# Patient Record
Sex: Female | Born: 1935 | Race: White | Hispanic: No | Smoking: Never smoker
Health system: Southern US, Community
[De-identification: ages and names within clinical notes are randomized; demographics above are authoritative.]

## PROBLEM LIST (undated history)

## (undated) DIAGNOSIS — I10 Essential (primary) hypertension: Secondary | ICD-10-CM

## (undated) DIAGNOSIS — F0280 Dementia in other diseases classified elsewhere without behavioral disturbance: Secondary | ICD-10-CM

## (undated) DIAGNOSIS — M810 Age-related osteoporosis without current pathological fracture: Secondary | ICD-10-CM

## (undated) DIAGNOSIS — G309 Alzheimer's disease, unspecified: Secondary | ICD-10-CM

## (undated) DIAGNOSIS — F0390 Unspecified dementia without behavioral disturbance: Secondary | ICD-10-CM

---

## 2004-06-15 ENCOUNTER — Ambulatory Visit: Payer: Self-pay | Admitting: Internal Medicine

## 2005-06-30 ENCOUNTER — Ambulatory Visit: Payer: Self-pay | Admitting: Internal Medicine

## 2005-12-09 ENCOUNTER — Ambulatory Visit: Payer: Self-pay | Admitting: Internal Medicine

## 2006-07-01 ENCOUNTER — Ambulatory Visit: Payer: Self-pay | Admitting: Internal Medicine

## 2007-03-30 ENCOUNTER — Ambulatory Visit: Payer: Self-pay | Admitting: Pain Medicine

## 2007-04-12 ENCOUNTER — Ambulatory Visit: Payer: Self-pay | Admitting: Pain Medicine

## 2007-05-16 ENCOUNTER — Ambulatory Visit: Payer: Self-pay | Admitting: Pain Medicine

## 2007-05-29 ENCOUNTER — Ambulatory Visit: Payer: Self-pay | Admitting: Pain Medicine

## 2007-06-27 ENCOUNTER — Ambulatory Visit: Payer: Self-pay | Admitting: Pain Medicine

## 2007-07-05 ENCOUNTER — Ambulatory Visit: Payer: Self-pay | Admitting: Pain Medicine

## 2007-07-19 ENCOUNTER — Ambulatory Visit: Payer: Self-pay | Admitting: Internal Medicine

## 2007-08-01 ENCOUNTER — Ambulatory Visit: Payer: Self-pay | Admitting: Pain Medicine

## 2007-08-07 ENCOUNTER — Ambulatory Visit: Payer: Self-pay | Admitting: Pain Medicine

## 2007-08-14 ENCOUNTER — Ambulatory Visit: Payer: Self-pay | Admitting: Pain Medicine

## 2007-08-29 ENCOUNTER — Ambulatory Visit: Payer: Self-pay | Admitting: Pain Medicine

## 2007-10-03 ENCOUNTER — Ambulatory Visit: Payer: Self-pay | Admitting: Pain Medicine

## 2008-07-19 ENCOUNTER — Ambulatory Visit: Payer: Self-pay | Admitting: Internal Medicine

## 2009-07-21 ENCOUNTER — Ambulatory Visit: Payer: Self-pay | Admitting: Internal Medicine

## 2010-05-02 ENCOUNTER — Ambulatory Visit: Payer: Self-pay

## 2010-07-01 ENCOUNTER — Ambulatory Visit: Payer: Self-pay | Admitting: General Practice

## 2010-07-27 ENCOUNTER — Ambulatory Visit: Payer: Self-pay | Admitting: Internal Medicine

## 2011-02-06 ENCOUNTER — Emergency Department: Payer: Self-pay | Admitting: Emergency Medicine

## 2011-03-03 ENCOUNTER — Ambulatory Visit: Payer: Self-pay | Admitting: Ophthalmology

## 2011-08-12 ENCOUNTER — Ambulatory Visit: Payer: Self-pay | Admitting: Internal Medicine

## 2012-08-16 ENCOUNTER — Ambulatory Visit: Payer: Self-pay | Admitting: Internal Medicine

## 2012-08-16 ENCOUNTER — Ambulatory Visit: Payer: Self-pay | Admitting: Ophthalmology

## 2012-08-28 ENCOUNTER — Ambulatory Visit: Payer: Self-pay | Admitting: Ophthalmology

## 2013-10-17 ENCOUNTER — Ambulatory Visit: Payer: Self-pay | Admitting: Internal Medicine

## 2014-10-18 NOTE — Op Note (Signed)
PATIENT NAME:  Madeline Franco, Madeline Franco MR#:  [id number] DATE OF BIRTH:  1920-07-30  DATE OF PROCEDURE:  08/28/2012  PREOPERATIVE DIAGNOSIS:  Cataract, right eye.   POSTOPERATIVE DIAGNOSIS:  Cataract, right eye.  PROCEDURE PERFORMED:  Extracapsular cataract extraction using phacoemulsification with placement of an Alcon [id number], 17.0-diopter posterior chamber lens, serial [id number].  SURGEON:  Erline LevineSteven A Dingeldein, MD  ASSISTANT:  None.  ANESTHESIA:  4% lidocaine and 0.75% Marcaine in a 50/50 mixture with 10 units/mL of Hylenex added given as a peribulbar.  ANESTHESIOLOGIST:  Cena BentonG. F. Van Staveren, MD    COMPLICATIONS:  None.  ESTIMATED BLOOD LOSS:  Less than 1 mL.  DESCRIPTION OF PROCEDURE:  The patient was brought to the operating room and given a peribulbar block.  The patient was then prepped and draped in the usual fashion.  The vertical rectus muscles were imbricated using 5-0 silk sutures.  These sutures were then clamped to the sterile drapes as bridle sutures.  A limbal peritomy was performed extending two clock hours and hemostasis was obtained with cautery.  A partial thickness scleral groove was made at the surgical limbus and dissected anteriorly in a lamellar dissection using an Alcon crescent knife.  The anterior chamber was entered superonasally with a Superblade and through the lamellar dissection with a 2.6 mm keratome.  DisCoVisc was used to replace the aqueous and a continuous tear capsulorrhexis was carried out.  Hydrodissection and hydrodelineation were carried out with balanced salt and a 27 gauge canula.  The nucleus was rotated to confirm the effectiveness of the hydrodissection.  Phacoemulsification was carried out using a divide-and-conquer technique.  Total ultrasound time was 1 minute and 3.5 seconds with an average power of 24.0 percent, CDE of 28.16.  Irrigation/aspiration was used to remove the residual cortex.  DisCoVisc was used to inflate the capsule and the  internal incision was enlarged to 3 mm with the crescent knife.  The intraocular lens was folded and inserted into the capsular bag using the AcrySert Delivery System.   Irrigation/aspiration was used to remove the residual DisCoVisc.  Miostat was injected into the anterior chamber through the paracentesis track to inflate the anterior chamber and induce miosis; 0.1% of cefuroxime was injected into the anterior chamber directly above the lens.  The wound was checked for leaks and none were found. The conjunctiva was closed with cautery and the bridle sutures were removed.  Two drops of 0.3% Vigamox were placed on the eye.   An eye shield was placed on the eye.  The patient was discharged to the recovery room in good condition.  ____________________________ Maylon PeppersSteven A. Dingeldein, MD sad:cb D: 08/28/2012 13:34:58 ET T: 08/28/2012 13:48:06 ET JOB#: [id number]  cc: Viviann SpareSteven A. Dingeldein, MD, <Dictator> Erline LevineSTEVEN A DINGELDEIN MD ELECTRONICALLY SIGNED 09/04/2012 8:23

## 2016-07-12 ENCOUNTER — Other Ambulatory Visit: Payer: Self-pay | Admitting: Orthopedic Surgery

## 2016-07-12 ENCOUNTER — Ambulatory Visit
Admission: RE | Admit: 2016-07-12 | Discharge: 2016-07-12 | Disposition: A | Discharge status: Home / Self Care | Payer: Medicare Other | Source: Ambulatory Visit | Attending: Orthopedic Surgery | Admitting: Orthopedic Surgery

## 2016-07-12 DIAGNOSIS — M546 Pain in thoracic spine: Secondary | ICD-10-CM | POA: Diagnosis present

## 2016-07-12 DIAGNOSIS — M47812 Spondylosis without myelopathy or radiculopathy, cervical region: Secondary | ICD-10-CM | POA: Insufficient documentation

## 2016-07-12 DIAGNOSIS — M438X4 Other specified deforming dorsopathies, thoracic region: Secondary | ICD-10-CM | POA: Diagnosis not present

## 2017-03-17 ENCOUNTER — Encounter: Payer: Self-pay | Admitting: Emergency Medicine

## 2017-03-17 ENCOUNTER — Emergency Department: Payer: Medicare Other

## 2017-03-17 ENCOUNTER — Emergency Department
Admission: EM | Admit: 2017-03-17 | Discharge: 2017-03-17 | Disposition: A | Discharge status: Home / Self Care | Payer: Medicare Other | Attending: Emergency Medicine | Admitting: Emergency Medicine

## 2017-03-17 DIAGNOSIS — G309 Alzheimer's disease, unspecified: Secondary | ICD-10-CM | POA: Insufficient documentation

## 2017-03-17 DIAGNOSIS — W06XXXA Fall from bed, initial encounter: Secondary | ICD-10-CM | POA: Insufficient documentation

## 2017-03-17 DIAGNOSIS — Y939 Activity, unspecified: Secondary | ICD-10-CM | POA: Insufficient documentation

## 2017-03-17 DIAGNOSIS — S41112A Laceration without foreign body of left upper arm, initial encounter: Secondary | ICD-10-CM

## 2017-03-17 DIAGNOSIS — Y999 Unspecified external cause status: Secondary | ICD-10-CM | POA: Insufficient documentation

## 2017-03-17 DIAGNOSIS — Z7982 Long term (current) use of aspirin: Secondary | ICD-10-CM | POA: Insufficient documentation

## 2017-03-17 DIAGNOSIS — Z79899 Other long term (current) drug therapy: Secondary | ICD-10-CM | POA: Diagnosis not present

## 2017-03-17 DIAGNOSIS — S41102A Unspecified open wound of left upper arm, initial encounter: Secondary | ICD-10-CM | POA: Insufficient documentation

## 2017-03-17 DIAGNOSIS — I10 Essential (primary) hypertension: Secondary | ICD-10-CM | POA: Insufficient documentation

## 2017-03-17 DIAGNOSIS — Y92193 Bedroom in other specified residential institution as the place of occurrence of the external cause: Secondary | ICD-10-CM | POA: Insufficient documentation

## 2017-03-17 DIAGNOSIS — F0280 Dementia in other diseases classified elsewhere without behavioral disturbance: Secondary | ICD-10-CM | POA: Diagnosis not present

## 2017-03-17 DIAGNOSIS — W19XXXA Unspecified fall, initial encounter: Secondary | ICD-10-CM

## 2017-03-17 HISTORY — DX: Alzheimer's disease, unspecified: G30.9

## 2017-03-17 HISTORY — DX: Age-related osteoporosis without current pathological fracture: M81.0

## 2017-03-17 HISTORY — DX: Unspecified dementia, unspecified severity, without behavioral disturbance, psychotic disturbance, mood disturbance, and anxiety: F03.90

## 2017-03-17 HISTORY — DX: Essential (primary) hypertension: I10

## 2017-03-17 HISTORY — DX: Dementia in other diseases classified elsewhere, unspecified severity, without behavioral disturbance, psychotic disturbance, mood disturbance, and anxiety: F02.80

## 2017-03-17 MED ORDER — ACETAMINOPHEN 325 MG PO TABS
650.0000 mg | ORAL_TABLET | Freq: Once | ORAL | Status: AC
  Administered 2017-03-17: 650 mg via ORAL
  Filled 2017-03-17: qty 2

## 2017-03-17 NOTE — ED Provider Notes (Signed)
[facility] Emergency Department Provider Note   ____________________________________________    I have reviewed the triage vital signs and the nursing notes.   HISTORY  Chief Complaint Fall     HPI Madeline Franco is a 83 y.o. female Who presents under a fall. Patient has a history of dementia but reports that she woke up this morning as she was falling out of her bed. She states she fell onto her back. She denies head or neck injury. She reports chronic back pain and is not sure if her pain is worse today or not.denies focal deficits, moves all extremities well. Medical record only contributory regarding osteoporosis and dementia   Past Medical History:  Diagnosis Date  . Alzheimer disease   . Dementia   . Hypertension   . Osteoporosis     There are no active problems to display for this patient.   History reviewed. No pertinent surgical history.  Prior to Admission medications   Medication Sig Start Date End Date Taking? Authorizing Provider  aspirin 81 MG chewable tablet Chew 81 mg by mouth daily.   Yes [provider]  Calcium Carbonate-Vitamin D (CALTRATE 600+D PO) Take by mouth.   Yes [provider]  latanoprost (XALATAN) 0.005 % ophthalmic solution Place 1 drop into both eyes at bedtime.   Yes [provider]  Meloxicam 7.5 MG/5ML SUSP Take by mouth.   Yes [provider]  sertraline (ZOLOFT) 25 MG tablet Take 25 mg by mouth daily.   Yes [provider]     Allergies Celebrex [celecoxib] and Naprosyn [naproxen]  No family history on file.  Social History Social History  Substance Use Topics  . Smoking status: Never Smoker  . Smokeless tobacco: Never Used  . Alcohol use No    Review of Systems  Constitutional: Nodizziness Eyes: No visual changes.  ENT: No neck pain Cardiovascular: Denies chest pain. Respiratory: Denies shortness of breath.no cough Gastrointestinal: No  abdominal pain.   Genitourinary: no burning with urination Musculoskeletal: as above Skin: skin tear left arm Neurological: Negative for headaches or weakness   ____________________________________________   PHYSICAL EXAM:  VITAL SIGNS: ED Triage Vitals  Enc Vitals Group     BP 03/17/17 0641 (!) 154/83     Pulse Rate 03/17/17 0641 74     Resp 03/17/17 0641 18     Temp 03/17/17 0641 98.1 F (36.7 C)     Temp Source 03/17/17 0641 Oral     SpO2 03/17/17 0641 97 %     Weight 03/17/17 0641 65.8 kg (145 lb)     Height 03/17/17 0641 1.626 m ( )     Head Circumference --      Peak Flow --      Pain Score 03/17/17 0640 4     Pain Loc --      Pain Edu? --      Excl. in GC? --     Constitutional: Alert. No acute distress. Pleasant and interactive Eyes: Conjunctivae are normal.  Head: Atraumatic. Nose: No congestion/rhinnorhea. Mouth/Throat: Mucous membranes are moist.   Neck:  Painless ROM, no vertebral tenderness to palpation Cardiovascular: Normal rate, regular rhythm.  Good peripheral circulation. Respiratory: Normal respiratory effort.  No retractions. Lungs CTAB. Gastrointestinal: Soft and nontender. No distention.  Genitourinary: deferred Musculoskeletal: back: No vertebral tenderness to palpation of the thoracic or lumbar spine.No bruising noted.No lower extremity tenderness with full range of motion. No pain with axial load of  both hips Normal range of motion of upper extremities without pain..  Warm and well perfusediffusely Neurologic:  Normal speech and language. No gross focal neurologic deficits are appreciated.  Skin:  Skin is warm, dry. Small 3 cm curvilinear skin tear left lateral distal upper arm just proximal to the elbow Psychiatric: Mood and affect are normal. Speech and behavior are normal.  ____________________________________________   LABS (all labs ordered are listed, but only abnormal results are displayed)  Labs Reviewed - No data to  display ____________________________________________  EKG  None ____________________________________________  RADIOLOGY  X-rays ordered of the lumbar and thoracic spine given history of osteoporosis No acute fractures noted on review, chronic compression fracture T8 ____________________________________________   PROCEDURES  Procedure(s) performed: No    Critical Care performed:No ____________________________________________   INITIAL IMPRESSION / ASSESSMENT AND PLAN / ED COURSE  Pertinent labs & imaging results that were available during my care of the patient were reviewed by me and considered in my medical decision making (see chart for details).  Patient with fall from bed height. No bony abnormality is on exam. Given her osteoporosis and back pain we will x-ray the thoracic and lumbar spine. No indication for head CT and normal C-spine exam. No blood thinners  ----------------------------------------- 8:45 AM on 03/17/2017 -----------------------------------------  X-ray results are reassuring, the patient was able to sit up on her own and stand. She uses a walker at home but I feel she is appropriate for discharge this time with outpatient follow-up. Given Tylenol for pain    ____________________________________________   FINAL CLINICAL IMPRESSION(S) / ED DIAGNOSES  Final diagnoses:  Fall, initial encounter  Skin tear of left upper arm without complication, initial encounter      NEW MEDICATIONS STARTED DURING THIS VISIT:  New Prescriptions   No medications on file     Note:  This document was prepared using Dragon voice recognition software and may include unintentional dictation errors.    Jene Every, MD 03/17/17 [phone]

## 2017-03-17 NOTE — Discharge Instructions (Signed)
Madeline Franco was seen today after a fall from her bed. She had xrays of her spine which were unchanged from prior. She has an old thoracic compression fracture which is unchanged. Recommend tylenol for pain. She may need more assistance although she was able to sit herself up and ambulate, she has her walker at home. She appears to have chronic back pain from her compression fracture. Please follow up with Dr. Hyacinth Meeker

## 2017-03-17 NOTE — ED Notes (Signed)
Pt placed on bedpan to urinate. Pt voided without difficulty.

## 2017-03-17 NOTE — ED Triage Notes (Signed)
Pt presents to ED 02 from [facility] c/o fall today; pt states falling out of bed this morning; pt denies hitting head, or syncopal episode; per EMS pt was sitting in a chair when they arrived on scene; pt is c/o pain in the lower back that is chronic; pt has a small skin tear on the upper lateral left arm. Pt denies any other pain. Pt is alert, awake and oriented only to herself; pt is verbal and responds to questions.

## 2017-03-17 NOTE — ED Notes (Signed)
Pt placed on bedpan. Pt voided without difficulty.

## 2017-03-17 NOTE — ED Notes (Signed)
Pt placed on bedpan to urinate. Pt voided without difficulty. 

## 2017-03-17 NOTE — ED Notes (Signed)
Pt placed on bedpan. Pt urinated without difficulty.

## 2018-11-10 ENCOUNTER — Emergency Department: Payer: Medicare Other

## 2018-11-10 ENCOUNTER — Emergency Department
Admission: EM | Admit: 2018-11-10 | Discharge: 2018-11-10 | Disposition: A | Discharge status: Other Acute Inpatient Hospital | Payer: Medicare Other | Attending: Emergency Medicine | Admitting: Emergency Medicine

## 2018-11-10 ENCOUNTER — Other Ambulatory Visit: Payer: Self-pay

## 2018-11-10 DIAGNOSIS — M25559 Pain in unspecified hip: Secondary | ICD-10-CM | POA: Diagnosis present

## 2018-11-10 DIAGNOSIS — Z7982 Long term (current) use of aspirin: Secondary | ICD-10-CM | POA: Insufficient documentation

## 2018-11-10 DIAGNOSIS — Z79899 Other long term (current) drug therapy: Secondary | ICD-10-CM | POA: Insufficient documentation

## 2018-11-10 DIAGNOSIS — I10 Essential (primary) hypertension: Secondary | ICD-10-CM | POA: Insufficient documentation

## 2018-11-10 DIAGNOSIS — G309 Alzheimer's disease, unspecified: Secondary | ICD-10-CM | POA: Insufficient documentation

## 2018-11-10 DIAGNOSIS — W19XXXA Unspecified fall, initial encounter: Secondary | ICD-10-CM

## 2018-11-10 NOTE — ED Notes (Signed)
Pt walked with a walker with a steady gate to door and back- pt stated she had no pain while walking

## 2018-11-10 NOTE — ED Notes (Signed)
Report given to Tresa Endo, Charity fundraiser at [facility]. She requested pt be transported back via EMS.

## 2018-11-10 NOTE — ED Triage Notes (Signed)
Pt arrived via ems after having a fall in [facility]- pt states she fell off the bed and was in floor about 5 minutes- no external rotation on the right leg noted- pt complains of right hip and lower back pain

## 2018-11-10 NOTE — ED Notes (Signed)
Report and discharge instructions given to EMS and pt transported back to nursing home.

## 2018-11-10 NOTE — ED Notes (Signed)
Per pt family she ambulates with a walker at baseline

## 2018-11-10 NOTE — ED Notes (Signed)
Patient transported to X-ray 

## 2018-11-10 NOTE — ED Provider Notes (Signed)
[facility] Emergency Department Provider Note  ____________________________________________   I have reviewed the triage vital signs and the nursing notes. Where available I have reviewed prior notes and, if possible and indicated, outside hospital notes.    HISTORY  Chief Complaint Fall    HPI Madeline PieriniHelen E Franco is a 85 y.o. female for some dementia unfortunately, he also has an expressive aphasia, falls frequently, discussed with her son, EMS and they states she is at her baseline.  Patient had a fall today.  Non-syncopal fall.  She states she tripped.  She complained of hip pain on the way in, however she is not complaining of any pain at this time.  She states she feels "pretty good".  Limited history second patient baseline mental status.      Past Medical History:  Diagnosis Date  . Alzheimer disease (HCC)   . Dementia (HCC)   . Hypertension   . Osteoporosis     There are no active problems to display for this patient.   History reviewed. No pertinent surgical history.  Prior to Admission medications   Medication Sig Start Date End Date Taking? Authorizing Provider  aspirin 81 MG chewable tablet Chew 81 mg by mouth daily.    [provider]  Calcium Carbonate-Vitamin D (CALTRATE 600+D PO) Take by mouth.    [provider]  latanoprost (XALATAN) 0.005 % ophthalmic solution Place 1 drop into both eyes at bedtime.    [provider]  Meloxicam 7.5 MG/5ML SUSP Take by mouth.    [provider]  sertraline (ZOLOFT) 25 MG tablet Take 25 mg by mouth daily.    [provider]    Allergies Celebrex [celecoxib] and Naprosyn [naproxen]  History reviewed. No pertinent family history.  Social History Social History   Tobacco Use  . Smoking status: Never Smoker  . Smokeless tobacco: Never Used  Substance Use Topics  . Alcohol use: No  . Drug use: No    Review of Systems Constitutional: No  fever/chills Eyes: No visual changes. ENT: No sore throat. No stiff neck no neck pain Cardiovascular: Denies chest pain. Respiratory: Denies shortness of breath. Gastrointestinal:   no vomiting.  No diarrhea.  No constipation. Genitourinary: Negative for dysuria. Musculoskeletal: Negative lower extremity swelling Skin: Negative for rash. Neurological: Negative for severe headaches, focal weakness or numbness.   ____________________________________________   PHYSICAL EXAM:  VITAL SIGNS: ED Triage Vitals  Enc Vitals Group     BP 11/10/18 1626 (!) 168/74     Pulse Rate 11/10/18 1626 85     Resp --      Temp 11/10/18 1626 98.2 F (36.8 C)     Temp src --      SpO2 11/10/18 1626 100 %     Weight 11/10/18 1627 180 lb (81.6 kg)     Height 11/10/18 1627 5\' 5"  (1.651 m)     Head Circumference --      Peak Flow --      Pain Score 11/10/18 1627 0     Pain Loc --      Pain Edu? --      Excl. in GC? --     Constitutional: Alert and oriented to name and place unsure of the date pleasantly demented well appearing and in no acute distress. Eyes: Conjunctivae are normal Head: Atraumatic HEENT: No congestion/rhinnorhea. Mucous membranes are moist.  Oropharynx non-erythematous Neck:   Nontender with no meningismus, no masses, no stridor Cardiovascular: Normal rate,  regular rhythm. Grossly normal heart sounds.  Good peripheral circulation. Respiratory: Normal respiratory effort.  No retractions. Lungs CTAB. Abdominal: Soft and nontender. No distention. No guarding no rebound Back:  There is no focal tenderness or step off.  there is no midline tenderness there are no lesions noted. there is no CVA tenderness minimal discomfort around the coccyx but no evidence of trauma. Musculoskeletal: No lower extremity tenderness, no upper extremity tenderness. No joint effusions, no DVT signs strong distal pulses no edema I can fully without tenderness range both hips, both knees all in both ankles and  there is no evidence of upper extremity or torso injury Neurologic:  Normal speech and language. No gross focal neurologic deficits are appreciated.  Skin:  Skin is warm, dry and intact. No rash noted. Psychiatric: Mood and affect are little bit anxious. Speech and behavior are normal.  ____________________________________________   LABS (all labs ordered are listed, but only abnormal results are displayed)  Labs Reviewed - No data to display  Pertinent labs  results that were available during my care of the patient were reviewed by me and considered in my medical decision making (see chart for details). ____________________________________________  EKG  I personally interpreted any EKGs ordered by me or triage Sinus rhythm rate 69 bpm no acute ST elevation or depression, borderline LAD. ____________________________________________  RADIOLOGY  Pertinent labs & imaging results that were available during my care of the patient were reviewed by me and considered in my medical decision making (see chart for details). If possible, patient and/or family made aware of any abnormal findings.  No results found. ____________________________________________    PROCEDURES  Procedure(s) performed: None  Procedures  Critical Care performed: None  ____________________________________________   INITIAL IMPRESSION / ASSESSMENT AND PLAN / ED COURSE  Pertinent labs & imaging results that were available during my care of the patient were reviewed by me and considered in my medical decision making (see chart for details).  Patient here after a non-syncopal fall, x-rays are reassuring, family states that she usually can walk with a walker will ensure that she can ambulate with assistance at her baseline and try to get her home.  No evidence of acute decompensation will pathology.  Blood work is somewhat elevated but she is somewhat anxious about being here as her son stated that we should  expect.  She is DNR/DNI, and the strong preference is that we get her home safely and quickly without doing further work-up given prevalence of the coronavirus.  Patient seen and evaluated during the coronavirus epidemic during a time with low staffing    ____________________________________________   FINAL CLINICAL IMPRESSION(S) / ED DIAGNOSES  Final diagnoses:  None      This chart was dictated using voice recognition software.  Despite best efforts to proofread,  errors can occur which can change meaning.      Jeanmarie Plant, MD 11/10/18 [phone]

## 2018-12-22 ENCOUNTER — Emergency Department
Admission: EM | Admit: 2018-12-22 | Discharge: 2018-12-23 | Disposition: A | Discharge status: Skilled Nursing Facility | Payer: Medicare Other | Attending: Emergency Medicine | Admitting: Emergency Medicine

## 2018-12-22 ENCOUNTER — Emergency Department: Payer: Medicare Other

## 2018-12-22 ENCOUNTER — Other Ambulatory Visit: Payer: Self-pay

## 2018-12-22 DIAGNOSIS — F0280 Dementia in other diseases classified elsewhere without behavioral disturbance: Secondary | ICD-10-CM | POA: Diagnosis not present

## 2018-12-22 DIAGNOSIS — R531 Weakness: Secondary | ICD-10-CM | POA: Diagnosis present

## 2018-12-22 DIAGNOSIS — Z79899 Other long term (current) drug therapy: Secondary | ICD-10-CM | POA: Diagnosis not present

## 2018-12-22 DIAGNOSIS — N390 Urinary tract infection, site not specified: Secondary | ICD-10-CM | POA: Insufficient documentation

## 2018-12-22 DIAGNOSIS — I10 Essential (primary) hypertension: Secondary | ICD-10-CM | POA: Insufficient documentation

## 2018-12-22 DIAGNOSIS — G309 Alzheimer's disease, unspecified: Secondary | ICD-10-CM | POA: Insufficient documentation

## 2018-12-22 DIAGNOSIS — Z7982 Long term (current) use of aspirin: Secondary | ICD-10-CM | POA: Diagnosis not present

## 2018-12-22 LAB — CBC WITH DIFFERENTIAL/PLATELET
Abs Immature Granulocytes: 0.03 10*3/uL (ref 0.00–0.07)
Basophils Absolute: 0 10*3/uL (ref 0.0–0.1)
Basophils Relative: 0 %
Eosinophils Absolute: 0.1 10*3/uL (ref 0.0–0.5)
Eosinophils Relative: 1 %
HCT: 37.4 % (ref 36.0–46.0)
Hemoglobin: 12.5 g/dL (ref 12.0–15.0)
Immature Granulocytes: 0 %
Lymphocytes Relative: 16 %
Lymphs Abs: 1.6 10*3/uL (ref 0.7–4.0)
MCH: 34.8 pg — ABNORMAL HIGH (ref 26.0–34.0)
MCHC: 33.4 g/dL (ref 30.0–36.0)
MCV: 104.2 fL — ABNORMAL HIGH (ref 80.0–100.0)
Monocytes Absolute: 1.1 10*3/uL — ABNORMAL HIGH (ref 0.1–1.0)
Monocytes Relative: 11 %
Neutro Abs: 7.2 10*3/uL (ref 1.7–7.7)
Neutrophils Relative %: 72 %
Platelets: 183 10*3/uL (ref 150–400)
RBC: 3.59 MIL/uL — ABNORMAL LOW (ref 3.87–5.11)
RDW: 12.6 % (ref 11.5–15.5)
WBC: 10 10*3/uL (ref 4.0–10.5)
nRBC: 0 % (ref 0.0–0.2)

## 2018-12-22 LAB — COMPREHENSIVE METABOLIC PANEL
ALT: 18 U/L (ref 0–44)
AST: 26 U/L (ref 15–41)
Albumin: 4.1 g/dL (ref 3.5–5.0)
Alkaline Phosphatase: 80 U/L (ref 38–126)
Anion gap: 9 (ref 5–15)
BUN: 24 mg/dL — ABNORMAL HIGH (ref 8–23)
CO2: 25 mmol/L (ref 22–32)
Calcium: 9.4 mg/dL (ref 8.9–10.3)
Chloride: 101 mmol/L (ref 98–111)
Creatinine, Ser: 1.29 mg/dL — ABNORMAL HIGH (ref 0.44–1.00)
GFR calc Af Amer: 40 mL/min — ABNORMAL LOW (ref >60)
GFR calc non Af Amer: 35 mL/min — ABNORMAL LOW (ref >60)
Glucose, Bld: 162 mg/dL — ABNORMAL HIGH (ref 70–99)
Potassium: 4 mmol/L (ref 3.5–5.1)
Sodium: 135 mmol/L (ref 135–145)
Total Bilirubin: 0.9 mg/dL (ref 0.3–1.2)
Total Protein: 6.8 g/dL (ref 6.5–8.1)

## 2018-12-22 LAB — URINALYSIS, COMPLETE (UACMP) WITH MICROSCOPIC
Bilirubin Urine: NEGATIVE
Glucose, UA: NEGATIVE mg/dL
Hgb urine dipstick: NEGATIVE
Ketones, ur: NEGATIVE mg/dL
Nitrite: POSITIVE — AB
Protein, ur: NEGATIVE mg/dL
Specific Gravity, Urine: 1.019 (ref 1.005–1.030)
pH: 6 (ref 5.0–8.0)

## 2018-12-22 LAB — TROPONIN I (HIGH SENSITIVITY): Troponin I (High Sensitivity): 6 ng/L (ref <18)

## 2018-12-22 NOTE — ED Triage Notes (Signed)
Patient was observed with weakness tonight around 1700hrs. She started having tremors and was unable to feed herself. Lunch time today is the last known well. She is coming from [facility]. She is normally able to complete her own ADLS/IADLS.

## 2018-12-22 NOTE — ED Notes (Signed)
Patient transported to CT 

## 2018-12-22 NOTE — ED Provider Notes (Signed)
[facility] Emergency Department Provider Note   ____________________________________________   I have reviewed the triage vital signs and the nursing notes.   HISTORY  Chief Complaint Weakness   History limited by and level 5 caveat due to: Dementia   HPI Madeline Franco is a 85 y.o. female who presents to the emergency department today because of concern for weakness and difficulty with walking. Apparently staff noticed that patient was having difficulty with walking and weakness earlier today. The patient herself cannot give great history but does state that she felt dizzy. She denies any headache, chest pain, shortness of breath or fevers. The patient denies any falls.     Records reviewed. Per medical record review patient has a history of alzheimer's disease, dementia  Past Medical History:  Diagnosis Date  . Alzheimer disease (HCC)   . Dementia (HCC)   . Hypertension   . Osteoporosis     There are no active problems to display for this patient.   No past surgical history on file.  Prior to Admission medications   Medication Sig Start Date End Date Taking? Authorizing Provider  acetaminophen (TYLENOL) 325 MG tablet Take 650 mg by mouth every 4 (four) hours as needed for pain.    [provider]  aspirin 81 MG chewable tablet Chew 81 mg by mouth daily.    [provider]  Calcium Carbonate-Vitamin D (CALTRATE 600+D PO) Take 1 tablet by mouth daily.     [provider]  Cholecalciferol (VITAMIN D-1000 MAX ST) 25 MCG (1000 UT) tablet Take 1,000 Units by mouth 2 (two) times a day.    [provider]  latanoprost (XALATAN) 0.005 % ophthalmic solution Place 1 drop into both eyes at bedtime.    [provider]  magnesium oxide (MAG-OX) 400 MG tablet Take 400 mg by mouth daily.    [provider]  meloxicam (MOBIC) 7.5 MG tablet Take 7.5 mg by mouth daily. 08/08/17   [provider]   Multiple Vitamin (MULTIVITAMIN) capsule Take 1 capsule by mouth daily.    [provider]  sertraline (ZOLOFT) 25 MG tablet Take 25 mg by mouth daily.    [provider]    Allergies Celebrex [celecoxib] and Naprosyn [naproxen]  No family history on file.  Social History Social History   Tobacco Use  . Smoking status: Never Smoker  . Smokeless tobacco: Never Used  Substance Use Topics  . Alcohol use: No  . Drug use: No    Review of Systems Constitutional: No fever/chills Eyes: No visual changes. ENT: No sore throat. Cardiovascular: Denies chest pain. Respiratory: Denies shortness of breath. Gastrointestinal: No abdominal pain.  No nausea, no vomiting.  No diarrhea.   Genitourinary: Negative for dysuria. Musculoskeletal: Positive for leg pain. Skin: Negative for rash. Neurological: Positive for dizziness.  ____________________________________________   PHYSICAL EXAM:  VITAL SIGNS: ED Triage Vitals  Enc Vitals Group     BP 12/22/18 2308 (!) 168/87     Pulse Rate 12/22/18 2308 79     Resp 12/22/18 2308 (!) 25     Temp 12/22/18 2308 98.2 F (36.8 C)     Temp Source 12/22/18 2308 Oral     SpO2 12/22/18 2308 94 %     Weight 12/22/18 2310 145 lb (65.8 kg)     Height 12/22/18 2310 5\' 7"  (1.702 m)     Head Circumference --      Peak Flow --  Pain Score 12/22/18 2309 0   Constitutional: Awake and alert. Not completely oriented.  Eyes: Conjunctivae are normal.  ENT      Head: Normocephalic and atraumatic.      Nose: No congestion/rhinnorhea.      Mouth/Throat: Mucous membranes are moist.      Neck: No stridor. Hematological/Lymphatic/Immunilogical: No cervical lymphadenopathy. Cardiovascular: Normal rate, regular rhythm.  No murmurs, rubs, or gallops. Respiratory: Normal respiratory effort without tachypnea nor retractions. Breath sounds are clear and equal bilaterally. No wheezes/rales/rhonchi. Gastrointestinal: Soft and non tender. No  rebound. No guarding.  Genitourinary: Deferred Musculoskeletal: Normal range of motion in all extremities. Tender to palpation of the right hip.  Neurologic:  Dementia. Not completely oriented. No focal weakness.  Skin:  Skin is warm, dry and intact. No rash noted.  ____________________________________________    LABS (pertinent positives/negatives)  High sensitivity troponin 6 CMP wnl except glu 162, bun 24, cr 1.29 CBC wbc 10.0, hgb 12.5, plt 183 UA hazy, positive nitrite, moderate leukocytes, 11-20 rbc, 21-50 wbc  ____________________________________________   EKG  I, Nance Pear, attending physician, personally viewed and interpreted this EKG  EKG Time: 2259 Rate: 85 Rhythm: sinus rhythm Axis: left axis deviation Intervals: qtc 455 QRS: LAFB ST changes: no st elevation Impression: abnormal ekg   ____________________________________________    RADIOLOGY  CT head No acute abnormality  Right hip No acute abnormality  ____________________________________________   PROCEDURES  Procedures  ____________________________________________   INITIAL IMPRESSION / ASSESSMENT AND PLAN / ED COURSE  Pertinent labs & imaging results that were available during my care of the patient were reviewed by me and considered in my medical decision making (see chart for details).   Patient presented to the emergency department today because of concern for weakness and decreased ambulation. Work up concerning for UTI. Head CT negative, blood work negative. Patient will be given dose of abx here in the emergency department. Patient did have some right hip tenderness on exam, however x-ray was negative for fracture/dislocation.   ____________________________________________   FINAL CLINICAL IMPRESSION(S) / ED DIAGNOSES  Final diagnoses:  Generalized weakness  Lower urinary tract infectious disease     Note: This dictation was prepared with Dragon dictation. Any  transcriptional errors that result from this process are unintentional     Nance Pear, MD 12/23/18 [phone]

## 2018-12-23 ENCOUNTER — Emergency Department: Payer: Medicare Other

## 2018-12-23 ENCOUNTER — Other Ambulatory Visit: Payer: Self-pay

## 2018-12-23 ENCOUNTER — Observation Stay
Admission: EM | Admit: 2018-12-23 | Discharge: 2018-12-25 | Disposition: A | Discharge status: Skilled Nursing Facility | Payer: Medicare Other | Attending: Specialist | Admitting: Specialist

## 2018-12-23 DIAGNOSIS — M25561 Pain in right knee: Secondary | ICD-10-CM | POA: Diagnosis not present

## 2018-12-23 DIAGNOSIS — Z791 Long term (current) use of non-steroidal anti-inflammatories (NSAID): Secondary | ICD-10-CM | POA: Diagnosis not present

## 2018-12-23 DIAGNOSIS — M810 Age-related osteoporosis without current pathological fracture: Secondary | ICD-10-CM | POA: Diagnosis not present

## 2018-12-23 DIAGNOSIS — R531 Weakness: Secondary | ICD-10-CM | POA: Diagnosis not present

## 2018-12-23 DIAGNOSIS — M6281 Muscle weakness (generalized): Secondary | ICD-10-CM | POA: Insufficient documentation

## 2018-12-23 DIAGNOSIS — N390 Urinary tract infection, site not specified: Principal | ICD-10-CM | POA: Diagnosis present

## 2018-12-23 DIAGNOSIS — N179 Acute kidney failure, unspecified: Secondary | ICD-10-CM | POA: Diagnosis not present

## 2018-12-23 DIAGNOSIS — Z66 Do not resuscitate: Secondary | ICD-10-CM | POA: Insufficient documentation

## 2018-12-23 DIAGNOSIS — Z7982 Long term (current) use of aspirin: Secondary | ICD-10-CM | POA: Insufficient documentation

## 2018-12-23 DIAGNOSIS — E860 Dehydration: Secondary | ICD-10-CM | POA: Diagnosis not present

## 2018-12-23 DIAGNOSIS — Z1159 Encounter for screening for other viral diseases: Secondary | ICD-10-CM | POA: Insufficient documentation

## 2018-12-23 DIAGNOSIS — R296 Repeated falls: Secondary | ICD-10-CM | POA: Diagnosis not present

## 2018-12-23 DIAGNOSIS — Z886 Allergy status to analgesic agent status: Secondary | ICD-10-CM | POA: Diagnosis not present

## 2018-12-23 DIAGNOSIS — M4854XA Collapsed vertebra, not elsewhere classified, thoracic region, initial encounter for fracture: Secondary | ICD-10-CM | POA: Diagnosis not present

## 2018-12-23 DIAGNOSIS — W19XXXA Unspecified fall, initial encounter: Secondary | ICD-10-CM | POA: Diagnosis not present

## 2018-12-23 DIAGNOSIS — G309 Alzheimer's disease, unspecified: Secondary | ICD-10-CM | POA: Insufficient documentation

## 2018-12-23 DIAGNOSIS — H409 Unspecified glaucoma: Secondary | ICD-10-CM | POA: Insufficient documentation

## 2018-12-23 DIAGNOSIS — B9620 Unspecified Escherichia coli [E. coli] as the cause of diseases classified elsewhere: Secondary | ICD-10-CM | POA: Diagnosis not present

## 2018-12-23 DIAGNOSIS — M25551 Pain in right hip: Secondary | ICD-10-CM | POA: Insufficient documentation

## 2018-12-23 DIAGNOSIS — I10 Essential (primary) hypertension: Secondary | ICD-10-CM | POA: Diagnosis not present

## 2018-12-23 DIAGNOSIS — Z79899 Other long term (current) drug therapy: Secondary | ICD-10-CM | POA: Insufficient documentation

## 2018-12-23 DIAGNOSIS — R29898 Other symptoms and signs involving the musculoskeletal system: Secondary | ICD-10-CM | POA: Diagnosis not present

## 2018-12-23 DIAGNOSIS — N3000 Acute cystitis without hematuria: Secondary | ICD-10-CM

## 2018-12-23 DIAGNOSIS — R5381 Other malaise: Secondary | ICD-10-CM | POA: Diagnosis not present

## 2018-12-23 DIAGNOSIS — R2681 Unsteadiness on feet: Secondary | ICD-10-CM | POA: Diagnosis not present

## 2018-12-23 DIAGNOSIS — F0280 Dementia in other diseases classified elsewhere without behavioral disturbance: Secondary | ICD-10-CM | POA: Insufficient documentation

## 2018-12-23 LAB — BASIC METABOLIC PANEL
Anion gap: 9 (ref 5–15)
BUN: 24 mg/dL — ABNORMAL HIGH (ref 8–23)
CO2: 24 mmol/L (ref 22–32)
Calcium: 9.1 mg/dL (ref 8.9–10.3)
Chloride: 104 mmol/L (ref 98–111)
Creatinine, Ser: 1.4 mg/dL — ABNORMAL HIGH (ref 0.44–1.00)
GFR calc Af Amer: 37 mL/min — ABNORMAL LOW (ref >60)
GFR calc non Af Amer: 32 mL/min — ABNORMAL LOW (ref >60)
Glucose, Bld: 116 mg/dL — ABNORMAL HIGH (ref 70–99)
Potassium: 4.6 mmol/L (ref 3.5–5.1)
Sodium: 137 mmol/L (ref 135–145)

## 2018-12-23 LAB — URINALYSIS, COMPLETE (UACMP) WITH MICROSCOPIC
Bacteria, UA: NONE SEEN
Bilirubin Urine: NEGATIVE
Glucose, UA: NEGATIVE mg/dL
Hgb urine dipstick: NEGATIVE
Ketones, ur: 5 mg/dL — AB
Leukocytes,Ua: NEGATIVE
Nitrite: NEGATIVE
Protein, ur: NEGATIVE mg/dL
Specific Gravity, Urine: 1.016 (ref 1.005–1.030)
Squamous Epithelial / LPF: NONE SEEN (ref 0–5)
pH: 7 (ref 5.0–8.0)

## 2018-12-23 LAB — CBC WITH DIFFERENTIAL/PLATELET
Abs Immature Granulocytes: 0.06 10*3/uL (ref 0.00–0.07)
Basophils Absolute: 0 10*3/uL (ref 0.0–0.1)
Basophils Relative: 0 %
Eosinophils Absolute: 0 10*3/uL (ref 0.0–0.5)
Eosinophils Relative: 0 %
HCT: 36.8 % (ref 36.0–46.0)
Hemoglobin: 12.7 g/dL (ref 12.0–15.0)
Immature Granulocytes: 1 %
Lymphocytes Relative: 10 %
Lymphs Abs: 1.1 10*3/uL (ref 0.7–4.0)
MCH: 35.9 pg — ABNORMAL HIGH (ref 26.0–34.0)
MCHC: 34.5 g/dL (ref 30.0–36.0)
MCV: 104 fL — ABNORMAL HIGH (ref 80.0–100.0)
Monocytes Absolute: 1.1 10*3/uL — ABNORMAL HIGH (ref 0.1–1.0)
Monocytes Relative: 10 %
Neutro Abs: 9.1 10*3/uL — ABNORMAL HIGH (ref 1.7–7.7)
Neutrophils Relative %: 79 %
Platelets: 167 10*3/uL (ref 150–400)
RBC: 3.54 MIL/uL — ABNORMAL LOW (ref 3.87–5.11)
RDW: 12.5 % (ref 11.5–15.5)
WBC: 11.4 10*3/uL — ABNORMAL HIGH (ref 4.0–10.5)
nRBC: 0 % (ref 0.0–0.2)

## 2018-12-23 MED ORDER — HEPARIN SODIUM (PORCINE) 5000 UNIT/ML IJ SOLN
5000.0000 [IU] | Freq: Three times a day (TID) | INTRAMUSCULAR | Status: DC
  Administered 2018-12-23 – 2018-12-24 (×2): 5000 [IU] via SUBCUTANEOUS
  Filled 2018-12-23 (×2): qty 1

## 2018-12-23 MED ORDER — TRAZODONE HCL 50 MG PO TABS
50.0000 mg | ORAL_TABLET | Freq: Every day | ORAL | Status: DC
  Administered 2018-12-23 – 2018-12-24 (×2): 50 mg via ORAL
  Filled 2018-12-23 (×2): qty 1

## 2018-12-23 MED ORDER — BISACODYL 5 MG PO TBEC: 5.0000 mg | DELAYED_RELEASE_TABLET | Freq: Every day | ORAL | Status: DC | PRN

## 2018-12-23 MED ORDER — ONDANSETRON HCL 4 MG/2ML IJ SOLN: 4.0000 mg | Freq: Four times a day (QID) | INTRAMUSCULAR | Status: DC | PRN

## 2018-12-23 MED ORDER — ACETAMINOPHEN 325 MG PO TABS
650.0000 mg | ORAL_TABLET | Freq: Four times a day (QID) | ORAL | Status: DC | PRN
  Administered 2018-12-24: 22:00:00 650 mg via ORAL
  Filled 2018-12-23: qty 2

## 2018-12-23 MED ORDER — ACETAMINOPHEN 650 MG RE SUPP: 650.0000 mg | Freq: Four times a day (QID) | RECTAL | Status: DC | PRN

## 2018-12-23 MED ORDER — ONDANSETRON HCL 4 MG PO TABS: 4.0000 mg | ORAL_TABLET | Freq: Four times a day (QID) | ORAL | Status: DC | PRN

## 2018-12-23 MED ORDER — SODIUM CHLORIDE 0.9 % IV SOLN
1.0000 g | Freq: Once | INTRAVENOUS | Status: AC
  Administered 2018-12-23: 1 g via INTRAVENOUS
  Filled 2018-12-23: qty 10

## 2018-12-23 MED ORDER — VITAMIN D 25 MCG (1000 UNIT) PO TABS
1000.0000 [IU] | ORAL_TABLET | Freq: Two times a day (BID) | ORAL | Status: DC
  Administered 2018-12-24 – 2018-12-25 (×3): 1000 [IU] via ORAL
  Filled 2018-12-23 (×3): qty 1

## 2018-12-23 MED ORDER — LATANOPROST 0.005 % OP SOLN
1.0000 [drp] | Freq: Every day | OPHTHALMIC | Status: DC
  Administered 2018-12-23 – 2018-12-24 (×2): 1 [drp] via OPHTHALMIC
  Filled 2018-12-23: qty 2.5

## 2018-12-23 MED ORDER — ASPIRIN 81 MG PO CHEW
81.0000 mg | CHEWABLE_TABLET | Freq: Every day | ORAL | Status: DC
  Administered 2018-12-24 – 2018-12-25 (×2): 81 mg via ORAL
  Filled 2018-12-23 (×2): qty 1

## 2018-12-23 MED ORDER — SODIUM CHLORIDE 0.9 % IV SOLN
Freq: Once | INTRAVENOUS | Status: AC
  Administered 2018-12-23: 18:00:00 via INTRAVENOUS

## 2018-12-23 MED ORDER — CALCIUM CARBONATE 1250 (500 CA) MG PO TABS
500.0000 mg | ORAL_TABLET | Freq: Two times a day (BID) | ORAL | Status: DC
  Administered 2018-12-23 – 2018-12-25 (×2): 500 mg via ORAL
  Filled 2018-12-23 (×2): qty 1

## 2018-12-23 MED ORDER — SODIUM CHLORIDE 0.9 % IV SOLN
INTRAVENOUS | Status: DC
  Administered 2018-12-24 – 2018-12-25 (×2): via INTRAVENOUS

## 2018-12-23 MED ORDER — CEFTRIAXONE SODIUM 1 G IJ SOLR
1.0000 g | Freq: Once | INTRAMUSCULAR | Status: AC
  Administered 2018-12-23: 1 g via INTRAVENOUS
  Filled 2018-12-23: qty 10

## 2018-12-23 MED ORDER — OCUVITE-LUTEIN PO CAPS
1.0000 | ORAL_CAPSULE | Freq: Two times a day (BID) | ORAL | Status: DC
  Administered 2018-12-23: 1 via ORAL
  Filled 2018-12-23 (×5): qty 1

## 2018-12-23 MED ORDER — ADULT MULTIVITAMIN W/MINERALS CH
1.0000 | ORAL_TABLET | Freq: Every day | ORAL | Status: DC
  Administered 2018-12-24 – 2018-12-25 (×2): 1 via ORAL
  Filled 2018-12-23 (×2): qty 1

## 2018-12-23 MED ORDER — SENNOSIDES-DOCUSATE SODIUM 8.6-50 MG PO TABS: 1.0000 | ORAL_TABLET | Freq: Every evening | ORAL | Status: DC | PRN

## 2018-12-23 MED ORDER — CEPHALEXIN 500 MG PO CAPS: 500.0000 mg | ORAL_CAPSULE | Freq: Three times a day (TID) | ORAL | 0 refills | Status: DC

## 2018-12-23 MED ORDER — MAGNESIUM OXIDE 400 (241.3 MG) MG PO TABS
400.0000 mg | ORAL_TABLET | Freq: Every day | ORAL | Status: DC
  Administered 2018-12-24 – 2018-12-25 (×2): 400 mg via ORAL
  Filled 2018-12-23 (×3): qty 1

## 2018-12-23 MED ORDER — ALBUTEROL SULFATE (2.5 MG/3ML) 0.083% IN NEBU: 2.5000 mg | INHALATION_SOLUTION | RESPIRATORY_TRACT | Status: DC | PRN

## 2018-12-23 MED ORDER — SODIUM CHLORIDE 0.9 % IV SOLN
1.0000 g | INTRAVENOUS | Status: DC
  Administered 2018-12-24: 1 g via INTRAVENOUS
  Filled 2018-12-23: qty 10
  Filled 2018-12-23: qty 1

## 2018-12-23 NOTE — ED Notes (Signed)
Patient assisted to the bathroom 

## 2018-12-23 NOTE — ED Notes (Signed)
ED TO INPATIENT HANDOFF REPORT  ED Nurse Name and Phone #:  [phone]  S Name/Age/Gender Madeline RakeHelen E Franco 85 y.o. female Room/Bed: ED25A/ED25A  Code Status   Code Status: Not on file  Home/SNF/Other Home Patient oriented to: self Is this baseline? No   Triage Complete: Triage complete  Chief Complaint fall  Triage Note Patient was treated at this hospital yesterday for a fall. Patient is complaining of knee pain in her right knee. Patient is coming from an independent living facility. Per patient's son, she has had a steep decline in cognition over the past couple of months.   Allergies Allergies  Allergen Reactions  . Celebrex [Celecoxib]   . Naprosyn [Naproxen]     Level of Care/Admitting Diagnosis ED Disposition    ED Disposition Condition Comment   Admit  Hospital Area: [facility] [[id number]]  Level of Care: Med-Surg [16]  Covid Evaluation: Screening Protocol (No Symptoms)  Diagnosis: UTI (urinary tract infection) [191478][218863]  Admitting Physician: Shaune PollackHEN, QING [[id number]]  Attending Physician: Shaune PollackHEN, QING [phone][[id number]]  PT Class (Do Not Modify): Observation [104]  PT Acc Code (Do Not Modify): Observation [10022]       B Medical/Surgery History Past Medical History:  Diagnosis Date  . Alzheimer disease (HCC)   . Dementia (HCC)   . Hypertension   . Osteoporosis    No past surgical history on file.   A IV Location/Drains/Wounds Patient Lines/Drains/Airways Status   Active Line/Drains/Airways    Name:   Placement date:   Placement time:   Site:   Days:   Peripheral IV 12/23/18 Right Forearm   12/23/18    1713    Forearm   less than 1          Intake/Output Last 24 hours No intake or output data in the 24 hours ending 12/23/18 1931  Labs/Imaging Results for orders placed or performed during the hospital encounter of 12/23/18 (from the past 48 hour(s))  CBC with Differential     Status: Abnormal   Collection Time: 12/23/18  5:05 PM   Result Value Ref Range   WBC 11.4 (H) 4.0 - 10.5 K/uL   RBC 3.54 (L) 3.87 - 5.11 MIL/uL   Hemoglobin 12.7 12.0 - 15.0 g/dL   HCT 84.636.8 96.236.0 - 95.246.0 %   MCV 104.0 (H) 80.0 - 100.0 fL   MCH 35.9 (H) 26.0 - 34.0 pg   MCHC 34.5 30.0 - 36.0 g/dL   RDW 84.112.5 32.411.5 - 40.115.5 %   Platelets 167 150 - 400 K/uL   nRBC 0.0 0.0 - 0.2 %   Neutrophils Relative % 79 %   Neutro Abs 9.1 (H) 1.7 - 7.7 K/uL   Lymphocytes Relative 10 %   Lymphs Abs 1.1 0.7 - 4.0 K/uL   Monocytes Relative 10 %   Monocytes Absolute 1.1 (H) 0.1 - 1.0 K/uL   Eosinophils Relative 0 %   Eosinophils Absolute 0.0 0.0 - 0.5 K/uL   Basophils Relative 0 %   Basophils Absolute 0.0 0.0 - 0.1 K/uL   Immature Granulocytes 1 %   Abs Immature Granulocytes 0.06 0.00 - 0.07 K/uL    Comment: Performed at [facility], [address] [place], [place] [address]  Basic metabolic panel     Status: Abnormal   Collection Time: 12/23/18  5:05 PM  Result Value Ref Range   Sodium 137 135 - 145 mmol/L   Potassium 4.6 3.5 - 5.1 mmol/L   Chloride 104 98 -  111 mmol/L   CO2 24 22 - 32 mmol/L   Glucose, Bld 116 (H) 70 - 99 mg/dL   BUN 24 (H) 8 - 23 mg/dL   Creatinine, Ser 1.40 (H) 0.44 - 1.00 mg/dL   Calcium 9.1 8.9 - 10.3 mg/dL   GFR calc non Af Amer 32 (L) >60 mL/min   GFR calc Af Amer 37 (L) >60 mL/min   Anion gap 9 5 - 15    Comment: Performed at [facility], [address] [place], [place] [address]  Urinalysis, Complete w Microscopic     Status: Abnormal   Collection Time: 12/23/18  5:05 PM  Result Value Ref Range   Color, Urine YELLOW (A) YELLOW   APPearance CLEAR (A) CLEAR   Specific Gravity, Urine 1.016 1.005 - 1.030   pH 7.0 5.0 - 8.0   Glucose, UA NEGATIVE NEGATIVE mg/dL   Hgb urine dipstick NEGATIVE NEGATIVE   Bilirubin Urine NEGATIVE NEGATIVE   Ketones, ur 5 (A) NEGATIVE mg/dL   Protein, ur NEGATIVE NEGATIVE mg/dL   Nitrite NEGATIVE NEGATIVE   Leukocytes,Ua NEGATIVE NEGATIVE   RBC / HPF 0-5 0 - 5 RBC/hpf    WBC, UA 6-10 0 - 5 WBC/hpf   Bacteria, UA NONE SEEN NONE SEEN   Squamous Epithelial / LPF NONE SEEN 0 - 5   Mucus PRESENT     Comment: Performed at [facility], [address], [place], [place] [address]   Dg Chest 2 View  Result Date: 12/23/2018 CLINICAL DATA:  85 year old female status post possible fall EXAM: CHEST - 2 VIEW COMPARISON:  Prior thoracic spine radiographs 03/17/2017 FINDINGS: Mild cardiomegaly. The mediastinal contours are within normal limits. Atherosclerotic calcifications present in the transverse aorta. A 3.1 x 2.2 x 3.0 cm rounded opacity projects over the right upper lobe concerning for a primary bronchogenic neoplasm. No pleural effusion. Surgical clips in the right axilla suggest prior axillary nodal dissection. No acute osseous abnormality. Stable T8 compression fracture. IMPRESSION: 1. 3.1 cm mass in the anterior right upper lobe concerning for underlying primary bronchogenic cancer versus metastatic disease (given presence of surgical clips in the right axilla suggesting prior breast cancer). Consider further evaluation with CT scan of the chest. 2.  Aortic Atherosclerosis (ICD10-170.0). 3. Stable T8 compression fracture. Electronically Signed   By: Jacqulynn Cadet M.D.   On: 12/23/2018 16:59   Ct Head Wo Contrast  Result Date: 12/22/2018 CLINICAL DATA:  Altered mental status EXAM: CT HEAD WITHOUT CONTRAST TECHNIQUE: Contiguous axial images were obtained from the base of the skull through the vertex without intravenous contrast. COMPARISON:  CT brain 11/10/2018 FINDINGS: Brain: No acute territorial infarction, hemorrhage or intracranial mass. Atrophy and small vessel ischemic changes of the white matter. Stable ventricle size. Vascular: No hyperdense vessels.  Carotid vascular calcification Skull: Normal. Negative for fracture or focal lesion. Sinuses/Orbits: No acute finding. Other: None IMPRESSION: 1. No CT evidence for acute intracranial abnormality. 2.  Atrophy and small vessel ischemic changes of the white matter Electronically Signed   By: Donavan Foil M.D.   On: 12/22/2018 23:36   Dg Knee Complete 4 Views Right  Result Date: 12/23/2018 CLINICAL DATA:  Fall. Right knee pain. EXAM: RIGHT KNEE - COMPLETE 4+ VIEW COMPARISON:  Right knee MRI 05/02/2010 FINDINGS: No acute fracture or dislocation is identified. There is likely a small knee joint effusion. Tricompartmental degenerative changes are present including chronic depression and remodeling of the lateral tibial plateau which have progressed from the prior  MRI. Femorotibial chondrocalcinosis is noted, and there is extensive atherosclerotic vascular calcification. IMPRESSION: 1. No acute osseous abnormality identified. 2. Tricompartmental degenerative changes. Electronically Signed   By: Sebastian AcheAllen  Grady M.D.   On: 12/23/2018 17:00   Dg Hip Unilat W Or Wo Pelvis 2-3 Views Left  Result Date: 12/23/2018 CLINICAL DATA:  Fall.  Hip pain. EXAM: DG HIP (WITH OR WITHOUT PELVIS) 2-3V RIGHT COMPARISON:  12/22/2018 FINDINGS: No acute fracture or hip dislocation is identified. Mild bilateral hip osteoarthrosis is again noted. Advanced disc degeneration is partially visualized in the lower lumbar spine. Pelvic phleboliths and atherosclerotic vascular calcifications are noted. IMPRESSION: No acute osseous abnormality identified. Electronically Signed   By: Sebastian AcheAllen  Grady M.D.   On: 12/23/2018 17:03   Dg Hip Unilat W Or Wo Pelvis 2-3 Views Right  Result Date: 12/23/2018 CLINICAL DATA:  Fall. Right hip pain. EXAM: DG HIP (WITH OR WITHOUT PELVIS) 2-3V RIGHT COMPARISON:  12/22/2018 FINDINGS: No acute fracture or hip dislocation is identified. Mild bilateral hip osteoarthrosis is again noted. Advanced disc degeneration is partially visualized in the lower lumbar spine. Pelvic phleboliths and atherosclerotic vascular calcifications are noted. IMPRESSION: No acute osseous abnormality identified. Electronically Signed   By:  Sebastian AcheAllen  Grady M.D.   On: 12/23/2018 17:02   Dg Hip Unilat W Or Wo Pelvis 2-3 Views Right  Result Date: 12/23/2018 CLINICAL DATA:  Right hip pain. EXAM: DG HIP (WITH OR WITHOUT PELVIS) 2-3V RIGHT COMPARISON:  Nov 10, 2018 FINDINGS: There are degenerative changes of both hips. There is diffuse osteopenia. Multiple phleboliths project over the patient's pelvis. Vascular calcifications are noted. There is a subtle lucency coursing through the greater trochanter which does not appear to extend through the cortex and therefore is felt to be artifact. IMPRESSION: No acute displaced fracture or dislocation. Electronically Signed   By: Katherine Mantlehristopher  Green M.D.   On: 12/23/2018 00:21    Pending Labs Unresulted Labs (From admission, onward)    Start     Ordered   12/23/18 1549  Novel Coronavirus,NAA,(SEND-OUT TO REF LAB - TAT 24-48 hrs); Hosp Order  (Asymptomatic Patients Labs)  Once,   STAT    Question:  Rule Out  Answer:  Yes   12/23/18 1549   Signed and Held  Creatinine, serum  (heparin)  Once,   R    Comments: Baseline for heparin therapy IF NOT ALREADY DRAWN.    Signed and Held   Signed and Held  Basic metabolic panel  Tomorrow morning,   R     Signed and Held   Signed and Held  CBC  Tomorrow morning,   R     Signed and Held   Signed and Held  Urine Culture  Add-on,   STAT     Signed and Held          Vitals/Pain Today's Vitals   12/23/18 1600 12/23/18 1720 12/23/18 1730 12/23/18 1740  BP: (!) 141/67 (!) 155/76  (!) 154/77  Pulse: 67 92 97 80  Resp: 16 (!) 23 15 (!) 24  Temp:      TempSrc:      SpO2: 95% 97% 99% 96%  Weight:      Height:      PainSc:        Isolation Precautions No active isolations  Medications Medications  cefTRIAXone (ROCEPHIN) 1 g in sodium chloride 0.9 % 100 mL IVPB (1 g Intravenous New Bag/Given 12/23/18 1807)  0.9 %  sodium chloride infusion ( Intravenous New  Bag/Given 12/23/18 1806)    Mobility walks with person assist High fall risk   Focused  Assessments Neuro Assessment Handoff:  Swallow screen pass? N/A         Neuro Assessment: Exceptions to WDL Neuro Checks:      Last Documented NIHSS Modified Score:   Has TPA been given? No If patient is a Neuro Trauma and patient is going to OR before floor call report to 4N Charge nurse: [phone] or [phone]     R Recommendations: See Admitting Provider Note  Report given to:   Additional Notes:  Patient coming from assisted living. Dr. Imogene Burnhen approved COVID-19 send out test.

## 2018-12-23 NOTE — Discharge Instructions (Addendum)
Please seek medical attention for any high fevers, chest pain, shortness of breath, change in behavior, persistent vomiting, bloody stool or any other new or concerning symptoms.  

## 2018-12-23 NOTE — Progress Notes (Signed)
Advanced Care Plan.  Purpose of Encounter: Palliative care. Parties in Attendance: Patient, her son and me. Patient's Decisional Capacity: No. Medical Story: Madeline Franco  is a 85 y.o. female with a known history of Alzheimer's dementia, hypertension and osteoporosis.  She is being admitted for frequent fall, UTI and dehydration.  According to her son the patient has progressively declining for the past 3 years and worsening recently.  I discussed with her son about her current condition, poor prognosis and palliative care.  Her son said the patient is DNR. Goals of Care Determinations: Palliative care. Plan: Palliative care consult. Code Status: DNR.  Time spent discussing advance care planning: 18 minutes.

## 2018-12-23 NOTE — ED Triage Notes (Signed)
Patient was treated at this hospital yesterday for a fall. Patient is complaining of knee pain in her right knee. Patient is coming from an independent living facility. Per patient's son, she has had a steep decline in cognition over the past couple of months.

## 2018-12-23 NOTE — ED Notes (Signed)
Patient resting quietly with eyes closed in no acute distress.  

## 2018-12-23 NOTE — ED Notes (Signed)
Son Shanon Brow given update on plan of care.

## 2018-12-23 NOTE — ED Notes (Signed)
Patient's son Shanon Brow given update at this time.

## 2018-12-23 NOTE — H&P (Signed)
[facility] [place] [facility]   PATIENT NAME: Madeline ChessmanHelen Franco    MR#:  [id number]  DATE OF BIRTH:  1921/02/02  DATE OF ADMISSION:  12/23/2018  PRIMARY CARE PHYSICIAN: Danella PentonMiller, Mark F, MD   REQUESTING/REFERRING PHYSICIAN: Shaune PollackIsaacs, Cameron, MD  CHIEF COMPLAINT:   Chief Complaint  Patient presents with  . Fall   Fall and UTI HISTORY OF PRESENT ILLNESS:  Madeline Franco  is a 85 y.o. female with a known history of Alzheimer's dementia, hypertension and osteoporosis.  The patient is sent from assisted living facility to ED with above chief complaint.  The patient is not a good historian.  According to ED physician and her son, the patient has progressively functional declining over the past 3 years, worsening for the past few days.  She has very frequent fall yesterday and today.  She is found UTI and treated with antibiotics in the ED since last night.  She is found to dehydration.  ED physician request admission. PAST MEDICAL HISTORY:   Past Medical History:  Diagnosis Date  . Alzheimer disease (HCC)   . Dementia (HCC)   . Hypertension   . Osteoporosis     PAST SURGICAL HISTORY:  No past surgical history on file.  Tonsillectomy, mastectomy, knee arthroscopy.  SOCIAL HISTORY:   Social History   Tobacco Use  . Smoking status: Never Smoker  . Smokeless tobacco: Never Used  Substance Use Topics  . Alcohol use: No    FAMILY HISTORY:  No family history on file.  Father: Aortic aneurysm.  DRUG ALLERGIES:   Allergies  Allergen Reactions  . Celebrex [Celecoxib]   . Naprosyn [Naproxen]     REVIEW OF SYSTEMS:   Review of Systems  Unable to perform ROS: Dementia    MEDICATIONS AT HOME:   Prior to Admission medications   Medication Sig Start Date End Date Taking? Authorizing Provider  acetaminophen (TYLENOL) 325 MG tablet Take 650 mg by mouth at bedtime.    Yes [provider]  aspirin 81 MG chewable tablet Chew 81 mg by mouth daily.    Yes [provider]  calcium carbonate (OSCAL) 1500 (600 Ca) MG TABS tablet Take 600 mg of elemental calcium by mouth 2 (two) times daily.   Yes [provider]  Cholecalciferol (VITAMIN D-1000 MAX ST) 25 MCG (1000 UT) tablet Take 1,000 Units by mouth 2 (two) times a day.   Yes [provider]  latanoprost (XALATAN) 0.005 % ophthalmic solution Place 1 drop into both eyes at bedtime.   Yes [provider]  Magnesium 500 MG TABS Take 500 mg by mouth daily.   Yes [provider]  meloxicam (MOBIC) 7.5 MG tablet Take 7.5 mg by mouth daily. 08/08/17  Yes [provider]  Multiple Vitamin (MULTIVITAMIN WITH MINERALS) TABS tablet Take 1 tablet by mouth daily.   Yes [provider]  Multiple Vitamins-Minerals (PRESERVISION AREDS) CAPS Take 1 capsule by mouth 2 (two) times daily.   Yes [provider]  traZODone (DESYREL) 50 MG tablet Take 50 mg by mouth at bedtime.   Yes [provider]  triamcinolone cream (KENALOG) 0.1 % Apply 1 application topically 2 (two) times daily as needed (dry skin).   Yes [provider]  cephALEXin (KEFLEX) 500 MG capsule Take 1 capsule (500 mg total) by mouth 3 (three) times daily. 12/23/18   Phineas SemenGoodman, Graydon, MD      VITAL SIGNS:  Blood pressure (!) 154/77, pulse 80, temperature  98.3 F (36.8 C), temperature source Oral, resp. rate (!) 24, height 5\' 7"  (1.702 m), weight 65.8 kg, SpO2 96 %.  PHYSICAL EXAMINATION:  Physical Exam  GENERAL:  85 y.o.-year-old patient lying in the bed with no acute distress.  EYES: Pupils equal, round, reactive to light and accommodation. No scleral icterus. Extraocular muscles intact.  HEENT: Head atraumatic, normocephalic. Oropharynx and nasopharynx clear.  NECK:  Supple, no jugular venous distention. No thyroid enlargement, no tenderness.  LUNGS: Normal breath sounds bilaterally, no wheezing, rales,rhonchi or crepitation. No use of accessory muscles  of respiration.  CARDIOVASCULAR: S1, S2 normal. No murmurs, rubs, or gallops.  ABDOMEN: Soft, nontender, nondistended. Bowel sounds present. No organomegaly or mass.  EXTREMITIES: No pedal edema, cyanosis, or clubbing.  NEUROLOGIC: Cranial nerves II through XII are intact. Muscle strength 3-4/5 in all extremities. Sensation intact. Gait not checked.  PSYCHIATRIC: The patient is alert and oriented x 1-2.  SKIN: No obvious rash, lesion, or ulcer.   LABORATORY PANEL:   CBC Recent Labs  Lab 12/23/18 1705  WBC 11.4*  HGB 12.7  HCT 36.8  PLT 167   ------------------------------------------------------------------------------------------------------------------  Chemistries  Recent Labs  Lab 12/22/18 2320 12/23/18 1705  NA 135 137  K 4.0 4.6  CL 101 104  CO2 25 24  GLUCOSE 162* 116*  BUN 24* 24*  CREATININE 1.29* 1.40*  CALCIUM 9.4 9.1  AST 26  --   ALT 18  --   ALKPHOS 80  --   BILITOT 0.9  --    ------------------------------------------------------------------------------------------------------------------  Cardiac Enzymes No results for input(s): TROPONINI in the last 168 hours. ------------------------------------------------------------------------------------------------------------------  RADIOLOGY:  Dg Chest 2 View  Result Date: 12/23/2018 CLINICAL DATA:  85 year old female status post possible fall EXAM: CHEST - 2 VIEW COMPARISON:  Prior thoracic spine radiographs 03/17/2017 FINDINGS: Mild cardiomegaly. The mediastinal contours are within normal limits. Atherosclerotic calcifications present in the transverse aorta. A 3.1 x 2.2 x 3.0 cm rounded opacity projects over the right upper lobe concerning for a primary bronchogenic neoplasm. No pleural effusion. Surgical clips in the right axilla suggest prior axillary nodal dissection. No acute osseous abnormality. Stable T8 compression fracture. IMPRESSION: 1. 3.1 cm mass in the anterior right upper lobe concerning for  underlying primary bronchogenic cancer versus metastatic disease (given presence of surgical clips in the right axilla suggesting prior breast cancer). Consider further evaluation with CT scan of the chest. 2.  Aortic Atherosclerosis (ICD10-170.0). 3. Stable T8 compression fracture. Electronically Signed   By: Jacqulynn Cadet M.D.   On: 12/23/2018 16:59   Ct Head Wo Contrast  Result Date: 12/22/2018 CLINICAL DATA:  Altered mental status EXAM: CT HEAD WITHOUT CONTRAST TECHNIQUE: Contiguous axial images were obtained from the base of the skull through the vertex without intravenous contrast. COMPARISON:  CT brain 11/10/2018 FINDINGS: Brain: No acute territorial infarction, hemorrhage or intracranial mass. Atrophy and small vessel ischemic changes of the white matter. Stable ventricle size. Vascular: No hyperdense vessels.  Carotid vascular calcification Skull: Normal. Negative for fracture or focal lesion. Sinuses/Orbits: No acute finding. Other: None IMPRESSION: 1. No CT evidence for acute intracranial abnormality. 2. Atrophy and small vessel ischemic changes of the white matter Electronically Signed   By: Donavan Foil M.D.   On: 12/22/2018 23:36   Dg Knee Complete 4 Views Right  Result Date: 12/23/2018 CLINICAL DATA:  Fall. Right knee pain. EXAM: RIGHT KNEE - COMPLETE 4+ VIEW COMPARISON:  Right knee MRI 05/02/2010 FINDINGS: No acute fracture or dislocation is  identified. There is likely a small knee joint effusion. Tricompartmental degenerative changes are present including chronic depression and remodeling of the lateral tibial plateau which have progressed from the prior MRI. Femorotibial chondrocalcinosis is noted, and there is extensive atherosclerotic vascular calcification. IMPRESSION: 1. No acute osseous abnormality identified. 2. Tricompartmental degenerative changes. Electronically Signed   By: Sebastian AcheAllen  Grady M.D.   On: 12/23/2018 17:00   Dg Hip Unilat W Or Wo Pelvis 2-3 Views Left  Result  Date: 12/23/2018 CLINICAL DATA:  Fall.  Hip pain. EXAM: DG HIP (WITH OR WITHOUT PELVIS) 2-3V RIGHT COMPARISON:  12/22/2018 FINDINGS: No acute fracture or hip dislocation is identified. Mild bilateral hip osteoarthrosis is again noted. Advanced disc degeneration is partially visualized in the lower lumbar spine. Pelvic phleboliths and atherosclerotic vascular calcifications are noted. IMPRESSION: No acute osseous abnormality identified. Electronically Signed   By: Sebastian AcheAllen  Grady M.D.   On: 12/23/2018 17:03   Dg Hip Unilat W Or Wo Pelvis 2-3 Views Right  Result Date: 12/23/2018 CLINICAL DATA:  Fall. Right hip pain. EXAM: DG HIP (WITH OR WITHOUT PELVIS) 2-3V RIGHT COMPARISON:  12/22/2018 FINDINGS: No acute fracture or hip dislocation is identified. Mild bilateral hip osteoarthrosis is again noted. Advanced disc degeneration is partially visualized in the lower lumbar spine. Pelvic phleboliths and atherosclerotic vascular calcifications are noted. IMPRESSION: No acute osseous abnormality identified. Electronically Signed   By: Sebastian AcheAllen  Grady M.D.   On: 12/23/2018 17:02   Dg Hip Unilat W Or Wo Pelvis 2-3 Views Right  Result Date: 12/23/2018 CLINICAL DATA:  Right hip pain. EXAM: DG HIP (WITH OR WITHOUT PELVIS) 2-3V RIGHT COMPARISON:  Nov 10, 2018 FINDINGS: There are degenerative changes of both hips. There is diffuse osteopenia. Multiple phleboliths project over the patient's pelvis. Vascular calcifications are noted. There is a subtle lucency coursing through the greater trochanter which does not appear to extend through the cortex and therefore is felt to be artifact. IMPRESSION: No acute displaced fracture or dislocation. Electronically Signed   By: Katherine Mantlehristopher  Green M.D.   On: 12/23/2018 00:21      IMPRESSION AND PLAN:   Frequent fall The patient will be placed for observation. Fall and aspiration precaution.  PT evaluation. Acute renal failure due to dehydration.  IV fluid support and follow-up BMP.  UTI, Rocephin IV and follow-up urine culture. Dementia.  Aspiration fall precaution.   All the records are reviewed and case discussed with ED provider. Management plans discussed with the patient, her son and they are in agreement.  CODE STATUS:   TOTAL TIME TAKING CARE OF THIS PATIENT: 46 minutes.   Shaune PollackQing Orra Nolde M.D on 12/23/2018 at 6:57 PM  Between 7am to 6pm - Pager - [phone]  After 6pm go to [email or web address] - Social research officer, [email or web address] [email or web address]  [facility]  Office  [phone]  CC: Primary care physician; Danella PentonMiller, Mark F, MD   Note: This dictation was prepared with Dragon dictation along with smaller phrase technology. Any transcriptional errors that result from this process are unin

## 2018-12-23 NOTE — ED Provider Notes (Signed)
[facility] Emergency Department Provider Note  ____________________________________________   First MD Initiated Contact with Patient 12/23/18 1519     (approximate)  I have reviewed the triage vital signs and the nursing notes.   HISTORY  Chief Complaint Fall    HPI ADISA VIGEANT is a 85 y.o. female with history of dementia, limiting history.  Per review of records, she was just seen here yesterday following fall.  She reportedly lives in independent living but has had progressive functional decline over the last several days.  Patient reportedly returned home, after being treated for UTI.  She fell again this morning.  History somewhat limited due to her history of dementia.  She denies complaints on my assessment.  Per nursing report, the patient was here last night, went home, then had a second fall.  She complained of right knee and hip pain at the facility.  Denies any other complaints on my assessment.  Level 5 caveat invoked as remainder of history, ROS, and physical exam limited due to patient's AMS/dementia.         Past Medical History:  Diagnosis Date   Alzheimer disease ([place])    Dementia ([place])    Hypertension    Osteoporosis     There are no active problems to display for this patient.   No past surgical history on file.  Prior to Admission medications   Medication Sig Start Date End Date Taking? Authorizing Provider  acetaminophen (TYLENOL) 325 MG tablet Take 650 mg by mouth at bedtime.    Yes [provider]  aspirin 81 MG chewable tablet Chew 81 mg by mouth daily.   Yes [provider]  calcium carbonate (OSCAL) 1500 (600 Ca) MG TABS tablet Take 600 mg of elemental calcium by mouth 2 (two) times daily.   Yes [provider]  Cholecalciferol (VITAMIN D-1000 MAX ST) 25 MCG (1000 UT) tablet Take 1,000 Units by mouth 2 (two) times a day.   Yes [provider]  latanoprost (XALATAN) 0.005 %  ophthalmic solution Place 1 drop into both eyes at bedtime.   Yes [provider]  Magnesium 500 MG TABS Take 500 mg by mouth daily.   Yes [provider]  meloxicam (MOBIC) 7.5 MG tablet Take 7.5 mg by mouth daily. 08/08/17  Yes [provider]  Multiple Vitamin (MULTIVITAMIN WITH MINERALS) TABS tablet Take 1 tablet by mouth daily.   Yes [provider]  Multiple Vitamins-Minerals (PRESERVISION AREDS) CAPS Take 1 capsule by mouth 2 (two) times daily.   Yes [provider]  traZODone (DESYREL) 50 MG tablet Take 50 mg by mouth at bedtime.   Yes [provider]  triamcinolone cream (KENALOG) 0.1 % Apply 1 application topically 2 (two) times daily as needed (dry skin).   Yes [provider]  cephALEXin (KEFLEX) 500 MG capsule Take 1 capsule (500 mg total) by mouth 3 (three) times daily. 12/23/18   Nance Pear, MD    Allergies Celebrex [celecoxib] and Naprosyn [naproxen]  No family history on file.  Social History Social History   Tobacco Use   Smoking status: Never Smoker   Smokeless tobacco: Never Used  Substance Use Topics   Alcohol use: No   Drug use: No    Review of Systems  Review of Systems  Unable to perform ROS: Dementia     ____________________________________________  PHYSICAL EXAM:      VITAL SIGNS: ED Triage Vitals  Enc Vitals Group  BP 12/23/18 1524 131/67     Pulse Rate 12/23/18 1524 73     Resp 12/23/18 1524 16     Temp 12/23/18 1529 98.3 F (36.8 C)     Temp Source 12/23/18 1529 Oral     SpO2 12/23/18 1518 96 %     Weight 12/23/18 1531 145 lb 1 oz (65.8 kg)     Height 12/23/18 1531 5\' 7"  (1.702 m)     Head Circumference --      Peak Flow --      Pain Score 12/23/18 1530 0     Pain Loc --      Pain Edu? --      Excl. in GC? --      Physical Exam Vitals signs and nursing note reviewed.  Constitutional:      General: She is not in acute distress.    Appearance: She is  well-developed.  HENT:     Head: Normocephalic and atraumatic.     Comments: Mildly dry mucous membranes Eyes:     Conjunctiva/sclera: Conjunctivae normal.  Neck:     Musculoskeletal: Neck supple.  Cardiovascular:     Rate and Rhythm: Normal rate and regular rhythm.     Heart sounds: Normal heart sounds.  Pulmonary:     Effort: Pulmonary effort is normal. No respiratory distress.     Breath sounds: No wheezing.  Abdominal:     General: There is no distension.  Musculoskeletal:     Comments: Mild tenderness to external manipulation of bilateral hips and right knee.  No obvious deformity.  No tenderness throughout bilateral upper or lower extremities to palpation.  Skin:    General: Skin is warm.     Capillary Refill: Capillary refill takes less than 2 seconds.     Findings: No rash.  Neurological:     Mental Status: She is alert.     Motor: No abnormal muscle tone.     Comments: Alert, oriented to person only, which is baseline.  Intermittently follows commands.  Speech confused.       ____________________________________________   LABS (all labs ordered are listed, but only abnormal results are displayed)  Labs Reviewed  CBC WITH DIFFERENTIAL/PLATELET - Abnormal; Notable for the following components:      Result Value   WBC 11.4 (*)    RBC 3.54 (*)    MCV 104.0 (*)    MCH 35.9 (*)    Neutro Abs 9.1 (*)    Monocytes Absolute 1.1 (*)    All other components within normal limits  BASIC METABOLIC PANEL - Abnormal; Notable for the following components:   Glucose, Bld 116 (*)    BUN 24 (*)    Creatinine, Ser 1.40 (*)    GFR calc non Af Amer 32 (*)    GFR calc Af Amer 37 (*)    All other components within normal limits  URINALYSIS, COMPLETE (UACMP) WITH MICROSCOPIC - Abnormal; Notable for the following components:   Color, Urine YELLOW (*)    APPearance CLEAR (*)    Ketones, ur 5 (*)    All other components within normal limits  NOVEL CORONAVIRUS, NAA (HOSPITAL  ORDER, SEND-OUT TO REF LAB)    ____________________________________________  EKG: None ________________________________________  RADIOLOGY All imaging, including plain films, CT scans, and ultrasounds, independently reviewed by me, and interpretations confirmed via formal radiology reads.  ED MD interpretation:   Plain films: no acute fx or abnormality  Official radiology report(s): Dg Chest 2  View  Result Date: 12/23/2018 CLINICAL DATA:  85 year old female status post possible fall EXAM: CHEST - 2 VIEW COMPARISON:  Prior thoracic spine radiographs 03/17/2017 FINDINGS: Mild cardiomegaly. The mediastinal contours are within normal limits. Atherosclerotic calcifications present in the transverse aorta. A 3.1 x 2.2 x 3.0 cm rounded opacity projects over the right upper lobe concerning for a primary bronchogenic neoplasm. No pleural effusion. Surgical clips in the right axilla suggest prior axillary nodal dissection. No acute osseous abnormality. Stable T8 compression fracture. IMPRESSION: 1. 3.1 cm mass in the anterior right upper lobe concerning for underlying primary bronchogenic cancer versus metastatic disease (given presence of surgical clips in the right axilla suggesting prior breast cancer). Consider further evaluation with CT scan of the chest. 2.  Aortic Atherosclerosis (ICD10-170.0). 3. Stable T8 compression fracture. Electronically Signed   By: Malachy MoanHeath  McCullough M.D.   On: 12/23/2018 16:59   Ct Head Wo Contrast  Result Date: 12/22/2018 CLINICAL DATA:  Altered mental status EXAM: CT HEAD WITHOUT CONTRAST TECHNIQUE: Contiguous axial images were obtained from the base of the skull through the vertex without intravenous contrast. COMPARISON:  CT brain 11/10/2018 FINDINGS: Brain: No acute territorial infarction, hemorrhage or intracranial mass. Atrophy and small vessel ischemic changes of the white matter. Stable ventricle size. Vascular: No hyperdense vessels.  Carotid vascular  calcification Skull: Normal. Negative for fracture or focal lesion. Sinuses/Orbits: No acute finding. Other: None IMPRESSION: 1. No CT evidence for acute intracranial abnormality. 2. Atrophy and small vessel ischemic changes of the white matter Electronically Signed   By: Jasmine PangKim  Fujinaga M.D.   On: 12/22/2018 23:36   Dg Knee Complete 4 Views Right  Result Date: 12/23/2018 CLINICAL DATA:  Fall. Right knee pain. EXAM: RIGHT KNEE - COMPLETE 4+ VIEW COMPARISON:  Right knee MRI 05/02/2010 FINDINGS: No acute fracture or dislocation is identified. There is likely a small knee joint effusion. Tricompartmental degenerative changes are present including chronic depression and remodeling of the lateral tibial plateau which have progressed from the prior MRI. Femorotibial chondrocalcinosis is noted, and there is extensive atherosclerotic vascular calcification. IMPRESSION: 1. No acute osseous abnormality identified. 2. Tricompartmental degenerative changes. Electronically Signed   By: Sebastian AcheAllen  Grady M.D.   On: 12/23/2018 17:00   Dg Hip Unilat W Or Wo Pelvis 2-3 Views Left  Result Date: 12/23/2018 CLINICAL DATA:  Fall.  Hip pain. EXAM: DG HIP (WITH OR WITHOUT PELVIS) 2-3V RIGHT COMPARISON:  12/22/2018 FINDINGS: No acute fracture or hip dislocation is identified. Mild bilateral hip osteoarthrosis is again noted. Advanced disc degeneration is partially visualized in the lower lumbar spine. Pelvic phleboliths and atherosclerotic vascular calcifications are noted. IMPRESSION: No acute osseous abnormality identified. Electronically Signed   By: Sebastian AcheAllen  Grady M.D.   On: 12/23/2018 17:03   Dg Hip Unilat W Or Wo Pelvis 2-3 Views Right  Result Date: 12/23/2018 CLINICAL DATA:  Fall. Right hip pain. EXAM: DG HIP (WITH OR WITHOUT PELVIS) 2-3V RIGHT COMPARISON:  12/22/2018 FINDINGS: No acute fracture or hip dislocation is identified. Mild bilateral hip osteoarthrosis is again noted. Advanced disc degeneration is partially visualized  in the lower lumbar spine. Pelvic phleboliths and atherosclerotic vascular calcifications are noted. IMPRESSION: No acute osseous abnormality identified. Electronically Signed   By: Sebastian AcheAllen  Grady M.D.   On: 12/23/2018 17:02   Dg Hip Unilat W Or Wo Pelvis 2-3 Views Right  Result Date: 12/23/2018 CLINICAL DATA:  Right hip pain. EXAM: DG HIP (WITH OR WITHOUT PELVIS) 2-3V RIGHT COMPARISON:  Nov 10, 2018 FINDINGS:  There are degenerative changes of both hips. There is diffuse osteopenia. Multiple phleboliths project over the patient's pelvis. Vascular calcifications are noted. There is a subtle lucency coursing through the greater trochanter which does not appear to extend through the cortex and therefore is felt to be artifact. IMPRESSION: No acute displaced fracture or dislocation. Electronically Signed   By: Katherine Mantlehristopher  Green M.D.   On: 12/23/2018 00:21    ____________________________________________  PROCEDURES   Procedure(s) performed (including Critical Care):  Procedures  ____________________________________________  INITIAL IMPRESSION / MDM / ASSESSMENT AND PLAN / ED COURSE  As part of my medical decision making, I reviewed the following data within the electronic MEDICAL RECORD NUMBER Notes from prior ED visits and [place] Controlled Substance Database      *Joseph PieriniHelen E Dannenberg was evaluated in Emergency Department on 12/23/2018 for the symptoms described in the history of present illness. She was evaluated in the context of the global COVID-19 pandemic, which necessitated consideration that the patient might be at risk for infection with the SARS-CoV-2 virus that causes COVID-19. Institutional protocols and algorithms that pertain to the evaluation of patients at risk for COVID-19 are in a state of rapid change based on information released by regulatory bodies including the CDC and federal and state organizations. These policies and algorithms were followed during the patient's care in the ED.  Some  ED evaluations and interventions may be delayed as a result of limited staffing during the pandemic.*   Clinical Course as of Dec 22 1825  Sat Dec 23, 2018  1744 Spoke with son, Onalee HuaDavid, via telephone.   [CI]    Clinical Course User Index [CI] Shaune PollackIsaacs, Michaelyn Wall, MD    Medical Decision Making: 85 year old female here with second fall in the last 24 hours.  She appears more confused than usual.  Concern for encephalopathy secondary to UTI based on positive UA.  Lab work shows increasing white count, as well as BUN to creatinine ratio with ketonuria.  Concern for recurrent fall and altered mental status due to UTI.  Discussed case with son.  Will admit for fluids and antibiotics.  ____________________________________________  FINAL CLINICAL IMPRESSION(S) / ED DIAGNOSES  Final diagnoses:  Acute cystitis without hematuria  Fall, initial encounter     MEDICATIONS GIVEN DURING THIS VISIT:  Medications  cefTRIAXone (ROCEPHIN) 1 g in sodium chloride 0.9 % 100 mL IVPB (1 g Intravenous New Bag/Given 12/23/18 1807)  0.9 %  sodium chloride infusion ( Intravenous New Bag/Given 12/23/18 1806)     ED Discharge Orders    None       Note:  This document was prepared using Dragon voice recognition software and may include unintentional dictation errors.   Shaune PollackIsaacs, Ryelan Kazee, MD 12/23/18 [phone]

## 2018-12-24 DIAGNOSIS — N390 Urinary tract infection, site not specified: Secondary | ICD-10-CM | POA: Diagnosis not present

## 2018-12-24 LAB — BASIC METABOLIC PANEL
Anion gap: 9 (ref 5–15)
BUN: 18 mg/dL (ref 8–23)
CO2: 23 mmol/L (ref 22–32)
Calcium: 8.9 mg/dL (ref 8.9–10.3)
Chloride: 107 mmol/L (ref 98–111)
Creatinine, Ser: 1.16 mg/dL — ABNORMAL HIGH (ref 0.44–1.00)
GFR calc Af Amer: 46 mL/min — ABNORMAL LOW (ref >60)
GFR calc non Af Amer: 40 mL/min — ABNORMAL LOW (ref >60)
Glucose, Bld: 103 mg/dL — ABNORMAL HIGH (ref 70–99)
Potassium: 4 mmol/L (ref 3.5–5.1)
Sodium: 139 mmol/L (ref 135–145)

## 2018-12-24 LAB — CBC
HCT: 35.7 % — ABNORMAL LOW (ref 36.0–46.0)
Hemoglobin: 12.2 g/dL (ref 12.0–15.0)
MCH: 35.4 pg — ABNORMAL HIGH (ref 26.0–34.0)
MCHC: 34.2 g/dL (ref 30.0–36.0)
MCV: 103.5 fL — ABNORMAL HIGH (ref 80.0–100.0)
Platelets: 158 10*3/uL (ref 150–400)
RBC: 3.45 MIL/uL — ABNORMAL LOW (ref 3.87–5.11)
RDW: 12.4 % (ref 11.5–15.5)
WBC: 9.5 10*3/uL (ref 4.0–10.5)
nRBC: 0 % (ref 0.0–0.2)

## 2018-12-24 LAB — NOVEL CORONAVIRUS, NAA (HOSP ORDER, SEND-OUT TO REF LAB; TAT 18-24 HRS): SARS-CoV-2, NAA: NOT DETECTED

## 2018-12-24 MED ORDER — ENOXAPARIN SODIUM 30 MG/0.3ML ~~LOC~~ SOLN
30.0000 mg | SUBCUTANEOUS | Status: DC
  Administered 2018-12-24: 22:00:00 30 mg via SUBCUTANEOUS
  Filled 2018-12-24: qty 0.3

## 2018-12-24 MED ORDER — ENOXAPARIN SODIUM 40 MG/0.4ML ~~LOC~~ SOLN: 40.0000 mg | SUBCUTANEOUS | Status: DC

## 2018-12-24 NOTE — Progress Notes (Signed)
Anticoagulation monitoring(Lovenox):  85yo  female ordered Lovenox 40 mg Q24h  Filed Weights   12/23/18 1531 12/23/18 2100  Weight: 145 lb 1 oz (65.8 kg) 149 lb (67.6 kg)   BMI 27.25   Lab Results  Component Value Date   CREATININE 1.16 (H) 12/24/2018   CREATININE 1.40 (H) 12/23/2018   CREATININE 1.29 (H) 12/22/2018   Estimated Creatinine Clearance: 25.6 mL/min (A) (by C-G formula based on SCr of 1.16 mg/dL (H)). Hemoglobin & Hematocrit     Component Value Date/Time   HGB 12.2 12/24/2018 0620   HCT 35.7 (L) 12/24/2018 7353     Per Protocol for Patient with estCrcl < 30 ml/min and BMI < 40, will transition to Lovenox 30 mg Q24h.

## 2018-12-24 NOTE — Evaluation (Signed)
Physical Therapy Evaluation Patient Details Name: Madeline PieriniHelen E Franco MRN: [id number] DOB: January 11, 1921 Today's Date: 12/24/2018   History of Present Illness  [name] is a 89yoF who [facility] s/p fall with persitent Rt knee/hip pain. Pt was in ED the previous day for the same complaint. Pt admitted with UTI, dehydration. PMH: alz dementia, HTN, osteoporosys, expressive aphasia?, 2018 T8 compression fracture, chronic Right knee DJD. Pt resides at [facility]? but per son has has progressive cognitive decline over that past 3 years.  Clinical Impression  Pt admitted with above diagnosis. Pt currently with functional limitations due to the deficits listed below (see "PT Problem List"). Upon entry, pt in bed, awake and eating an already peeled banana half. The pt is alert and oriented to self, pleasant, conversational albeit almost exclusively off-topic and tangential, and unable to provide any history.ModA to EOB needing cues for participation and physical assistance for trunk and legs. Mod to maxA for transfers, pt much more stable and less anxious with RW, but needs heavy cues for safety. 90 seconds standing while trying to facilitate [facility] wherein patient has bowel and urine incontinency onto bed, floor, socks, gown, and toilet seat. Once on toilet, pt has no further voiding. modA to rise and maintains up with minA trunk support during pericare. SPT to recliner much more steady, but still with 3x LOB requiring assistance from author to avoid fall to floor. Functional mobility assessment demonstrates increased effort/time requirements, fair tolerance, and need for physical assistance, whereas the patient performed these at a higher level of independence PTA. If mentation this date is baseline, pt really needs a higher level of care, memory care would be appropriate, and 24/7 supervision is an absolute, particularly in the context of frequent falls. Pt would benefit from PT to  improve strength and mobility, could certainly participate, but would not necessarily benefit more so from high frequency services v lower frequency services. Pt will benefit from skilled PT intervention to increase independence and safety with basic mobility in preparation for discharge to the venue listed below.       Follow Up Recommendations Home health PT;Supervision/Assistance - 24 hour    Equipment Recommendations  None recommended by PT    Recommendations for Other Services       Precautions / Restrictions Precautions Precautions: Fall Restrictions Weight Bearing Restrictions: No      Mobility  Bed Mobility Overal bed mobility: Needs Assistance Bed Mobility: Supine to Sit     Supine to sit: Mod assist     General bed mobility comments: heavy multimodal cues needed d/t confusion; BLE weakness  Transfers Overall transfer level: Needs assistance Equipment used: Rolling walker (2 wheeled);None Transfers: Sit to/from Stand Sit to Stand: Max assist;Mod assist         General transfer comment: maxA dependent transfer 2/2 falls anxiety; modA with RW, cues needed for hand placement. Unsafe reaching for objects when anxious.  Ambulation/Gait Ambulation/Gait assistance: (not appropriate at this time. Pt struggling with balance and anxiety stepping to turn to chair.)              Stairs            Wheelchair Mobility    Modified Rankin (Stroke Patients Only)       Balance Overall balance assessment: History of Falls;Needs assistance Sitting-balance support: Feet supported;No upper extremity supported Sitting balance-Leahy Scale: Good     Standing balance support: Bilateral upper extremity supported;During functional  activity Standing balance-Leahy Scale: Poor                               Pertinent Vitals/Pain Pain Assessment: Faces Faces Pain Scale: Hurts even more Pain Location: intermittent Right knee painn with movement  OOB Pain Descriptors / Indicators: Aching Pain Intervention(s): Limited activity within patient's tolerance;Monitored during session;Repositioned    Home Living Family/patient expects to be discharged to:: Assisted living               Home Equipment: Other (comment) Additional Comments: Per EMR pt AMB with RW at [facility], typically independent in ADL; unable to corroborate this as patient has clear cognitive impairment    Prior Function Level of Independence: Needs assistance   Gait / Transfers Assistance Needed: facility AMB with RW, multiple falls in past typically from OOB to floor.  ADL's / Homemaking Assistance Needed: typically independent per EMR        Hand Dominance        Extremity/Trunk Assessment   Upper Extremity Assessment Upper Extremity Assessment: Generalized weakness    Lower Extremity Assessment Lower Extremity Assessment: Generalized weakness       Communication   Communication: HOH  Cognition Arousal/Alertness: Awake/alert   Overall Cognitive Status: History of cognitive impairments - at baseline                                 General Comments: tangential, inappropriate responses to questions >75% on time, poor emergent awareness (cannot distinguish between author speaking on the phone versus to the patient); does follow verbal commands>50% , and does well with visual cue support      General Comments      Exercises     Assessment/Plan    PT Assessment Patient needs continued PT services  PT Problem List Decreased strength;Decreased range of motion;Decreased activity tolerance;Decreased balance;Decreased mobility;Decreased coordination;Decreased cognition;Decreased knowledge of use of DME;Decreased safety awareness;Decreased knowledge of precautions;Pain       PT Treatment Interventions DME instruction;Gait training;Functional mobility training;Therapeutic activities;Therapeutic exercise;Patient/family  education;Cognitive remediation    PT Goals (Current goals can be found in the Care Plan section)  Acute Rehab PT Goals PT Goal Formulation: Patient unable to participate in goal setting Time For Goal Achievement: 01/07/19 Potential to Achieve Goals: Fair    Frequency Min 2X/week   Barriers to discharge Decreased caregiver support Pt needs a higher level of care    Co-evaluation               AM-PAC PT "6 Clicks" Mobility  Outcome Measure Help needed turning from your back to your side while in a flat bed without using bedrails?: A Lot Help needed moving from lying on your back to sitting on the side of a flat bed without using bedrails?: A Lot Help needed moving to and from a bed to a chair (including a wheelchair)?: A Lot Help needed standing up from a chair using your arms (e.g., wheelchair or bedside chair)?: A Lot Help needed to walk in hospital room?: Total Help needed climbing 3-5 steps with a railing? : Total 6 Click Score: 10    End of Session Equipment Utilized During Treatment: Gait belt Activity Tolerance: Other (comment)(weakness and related anxiety are limting; pt puts forth effort but is weak.) Patient left: in chair;with nursing/sitter in room;with chair alarm set;with call bell/phone within reach Nurse Communication:  Mobility status(level 5 code brown, level 3 code yellow) PT Visit Diagnosis: Unsteadiness on feet (R26.81);Difficulty in walking, not elsewhere classified (R26.2);Other symptoms and signs involving the nervous system (R29.898);Repeated falls (R29.6);Muscle weakness (generalized) (M62.81)    Time: [id number] PT Time Calculation (min) (ACUTE ONLY): 39 min   Charges:   PT Evaluation $PT Eval Moderate Complexity: 1 Mod PT Treatments $Self Care/Home Management: 8-22        12:31 PM, 12/24/18 Rosamaria LintsAllan C Erasmo Vertz, PT, DPT Physical Therapist - [facility]  [phone] (ASCOM)   Munachimso Palin C 12/24/2018,  12:24 PM

## 2018-12-24 NOTE — Progress Notes (Signed)
[facility] [place] [facility]     PATIENT NAME: Madeline ChessmanHelen Franco    MR#:  [id number]  DATE OF BIRTH:  Jul 17, 1920  SUBJECTIVE:   Pt. Presented to the hospital due to a fall and also noted to have recent UTI.  No other acute events overnight.    REVIEW OF SYSTEMS:    Review of Systems  Unable to perform ROS: Dementia    Nutrition: Regular diet Tolerating Diet: Yes Tolerating PT: Eval noted.   DRUG ALLERGIES:   Allergies  Allergen Reactions  . Celebrex [Celecoxib]   . Naprosyn [Naproxen]     VITALS:  Blood pressure 121/86, pulse 71, temperature (!) 97.5 F (36.4 C), resp. rate 18, height 5\' 2"  (1.575 m), weight 67.6 kg, SpO2 95 %.  PHYSICAL EXAMINATION:   Physical Exam  GENERAL:  85 y.o.-year-old patient lying in bed in no acute distress.  EYES: Pupils equal, round, reactive to light and accommodation. No scleral icterus. Extraocular muscles intact.  HEENT: Head atraumatic, normocephalic. Oropharynx and nasopharynx clear.  NECK:  Supple, no jugular venous distention. No thyroid enlargement, no tenderness.  LUNGS: Normal breath sounds bilaterally, no wheezing, rales, rhonchi. No use of accessory muscles of respiration.  CARDIOVASCULAR: S1, S2 normal. No murmurs, rubs, or gallops.  ABDOMEN: Soft, nontender, nondistended. Bowel sounds present. No organomegaly or mass.  EXTREMITIES: No cyanosis, clubbing or edema b/l.    NEUROLOGIC: Cranial nerves II through XII are intact. No focal Motor or sensory deficits b/l.  Globally weak.   PSYCHIATRIC: The patient is alert and oriented x 1.    SKIN: No obvious rash, lesion, or ulcer.    LABORATORY PANEL:   CBC Recent Labs  Lab 12/24/18 0620  WBC 9.5  HGB 12.2  HCT 35.7*  PLT 158   ------------------------------------------------------------------------------------------------------------------  Chemistries  Recent Labs  Lab 12/22/18 2320  12/24/18 0620  NA 135   < > 139  K 4.0   < > 4.0  CL  101   < > 107  CO2 25   < > 23  GLUCOSE 162*   < > 103*  BUN 24*   < > 18  CREATININE 1.29*   < > 1.16*  CALCIUM 9.4   < > 8.9  AST 26  --   --   ALT 18  --   --   ALKPHOS 80  --   --   BILITOT 0.9  --   --    < > = values in this interval not displayed.   ------------------------------------------------------------------------------------------------------------------  Cardiac Enzymes No results for input(s): TROPONINI in the last 168 hours. ------------------------------------------------------------------------------------------------------------------  RADIOLOGY:  Dg Chest 2 View  Result Date: 12/23/2018 CLINICAL DATA:  85 year old female status post possible fall EXAM: CHEST - 2 VIEW COMPARISON:  Prior thoracic spine radiographs 03/17/2017 FINDINGS: Mild cardiomegaly. The mediastinal contours are within normal limits. Atherosclerotic calcifications present in the transverse aorta. A 3.1 x 2.2 x 3.0 cm rounded opacity projects over the right upper lobe concerning for a primary bronchogenic neoplasm. No pleural effusion. Surgical clips in the right axilla suggest prior axillary nodal dissection. No acute osseous abnormality. Stable T8 compression fracture. IMPRESSION: 1. 3.1 cm mass in the anterior right upper lobe concerning for underlying primary bronchogenic cancer versus metastatic disease (given presence of surgical clips in the right axilla suggesting prior breast cancer). Consider further evaluation with CT scan of the chest. 2.  Aortic Atherosclerosis (ICD10-170.0). 3. Stable T8 compression fracture. Electronically Signed  By: Malachy MoanHeath  McCullough M.D.   On: 12/23/2018 16:59   Ct Head Wo Contrast  Result Date: 12/22/2018 CLINICAL DATA:  Altered mental status EXAM: CT HEAD WITHOUT CONTRAST TECHNIQUE: Contiguous axial images were obtained from the base of the skull through the vertex without intravenous contrast. COMPARISON:  CT brain 11/10/2018 FINDINGS: Brain: No acute territorial  infarction, hemorrhage or intracranial mass. Atrophy and small vessel ischemic changes of the white matter. Stable ventricle size. Vascular: No hyperdense vessels.  Carotid vascular calcification Skull: Normal. Negative for fracture or focal lesion. Sinuses/Orbits: No acute finding. Other: None IMPRESSION: 1. No CT evidence for acute intracranial abnormality. 2. Atrophy and small vessel ischemic changes of the white matter Electronically Signed   By: Jasmine PangKim  Fujinaga M.D.   On: 12/22/2018 23:36   Dg Knee Complete 4 Views Right  Result Date: 12/23/2018 CLINICAL DATA:  Fall. Right knee pain. EXAM: RIGHT KNEE - COMPLETE 4+ VIEW COMPARISON:  Right knee MRI 05/02/2010 FINDINGS: No acute fracture or dislocation is identified. There is likely a small knee joint effusion. Tricompartmental degenerative changes are present including chronic depression and remodeling of the lateral tibial plateau which have progressed from the prior MRI. Femorotibial chondrocalcinosis is noted, and there is extensive atherosclerotic vascular calcification. IMPRESSION: 1. No acute osseous abnormality identified. 2. Tricompartmental degenerative changes. Electronically Signed   By: Sebastian AcheAllen  Grady M.D.   On: 12/23/2018 17:00   Dg Hip Unilat W Or Wo Pelvis 2-3 Views Left  Result Date: 12/23/2018 CLINICAL DATA:  Fall.  Hip pain. EXAM: DG HIP (WITH OR WITHOUT PELVIS) 2-3V RIGHT COMPARISON:  12/22/2018 FINDINGS: No acute fracture or hip dislocation is identified. Mild bilateral hip osteoarthrosis is again noted. Advanced disc degeneration is partially visualized in the lower lumbar spine. Pelvic phleboliths and atherosclerotic vascular calcifications are noted. IMPRESSION: No acute osseous abnormality identified. Electronically Signed   By: Sebastian AcheAllen  Grady M.D.   On: 12/23/2018 17:03   Dg Hip Unilat W Or Wo Pelvis 2-3 Views Right  Result Date: 12/23/2018 CLINICAL DATA:  Fall. Right hip pain. EXAM: DG HIP (WITH OR WITHOUT PELVIS) 2-3V RIGHT  COMPARISON:  12/22/2018 FINDINGS: No acute fracture or hip dislocation is identified. Mild bilateral hip osteoarthrosis is again noted. Advanced disc degeneration is partially visualized in the lower lumbar spine. Pelvic phleboliths and atherosclerotic vascular calcifications are noted. IMPRESSION: No acute osseous abnormality identified. Electronically Signed   By: Sebastian AcheAllen  Grady M.D.   On: 12/23/2018 17:02   Dg Hip Unilat W Or Wo Pelvis 2-3 Views Right  Result Date: 12/23/2018 CLINICAL DATA:  Right hip pain. EXAM: DG HIP (WITH OR WITHOUT PELVIS) 2-3V RIGHT COMPARISON:  Nov 10, 2018 FINDINGS: There are degenerative changes of both hips. There is diffuse osteopenia. Multiple phleboliths project over the patient's pelvis. Vascular calcifications are noted. There is a subtle lucency coursing through the greater trochanter which does not appear to extend through the cortex and therefore is felt to be artifact. IMPRESSION: No acute displaced fracture or dislocation. Electronically Signed   By: Katherine Mantlehristopher  Green M.D.   On: 12/23/2018 00:21     ASSESSMENT AND PLAN:   85 year old female with past medical history of Alzheimer's dementia, hypertension, osteoporosis who presented to the hospital after a fall or being found down and was noted to have a UTI.  1.  Weakness/fall-secondary to underlying UTI with deconditioning and also underlying dementia. - Continue IV antibiotics for the UTI.  PT evaluation noted and they recommend home health and will discuss with social  work regarding Programmer, systems.  2.  Urinary tract infection-continue IV ceftriaxone, urine cultures are growing E. coli but sensitivities pending.  Will switch to oral antibiotics tomorrow.  3.  Glaucoma-continue latanoprost eyedrops.  4.  Osteoporosis-continue calcium supplements with vitamin D supplements.  Discussed plan of care with patient's son over the phone.  All the records are reviewed and case discussed with Care  Management/Social Worker. Management plans discussed with the patient, family and they are in agreement.  CODE STATUS: DNR  DVT Prophylaxis: Lovenox  TOTAL TIME TAKING CARE OF THIS PATIENT: 30 minutes.   POSSIBLE D/C IN 1-2 DAYS, DEPENDING ON CLINICAL CONDITION.   Henreitta Leber M.D on 12/24/2018 at 12:37 PM  Between 7am to 6pm - Pager - [phone]  After 6pm go to [email or web address] - Proofreader  [facility]  Office  [phone]  CC: Primary care physician; Rusty Aus, MD

## 2018-12-24 NOTE — Care Management Obs Status (Signed)
[place] NOTIFICATION   Patient Details  Name: Madeline Franco MRN: [id number] Date of Birth: 1920/12/20   Medicare Observation Status Notification Given:  Yes    Braydin Aloi A Kelliann Pendergraph, RN 12/24/2018, 8:36 AM

## 2018-12-25 DIAGNOSIS — Z7189 Other specified counseling: Secondary | ICD-10-CM

## 2018-12-25 DIAGNOSIS — W19XXXA Unspecified fall, initial encounter: Secondary | ICD-10-CM

## 2018-12-25 DIAGNOSIS — Z515 Encounter for palliative care: Secondary | ICD-10-CM | POA: Diagnosis not present

## 2018-12-25 DIAGNOSIS — N390 Urinary tract infection, site not specified: Secondary | ICD-10-CM | POA: Diagnosis not present

## 2018-12-25 DIAGNOSIS — N3000 Acute cystitis without hematuria: Secondary | ICD-10-CM | POA: Diagnosis not present

## 2018-12-25 LAB — BASIC METABOLIC PANEL
Anion gap: 7 (ref 5–15)
BUN: 27 mg/dL — ABNORMAL HIGH (ref 8–23)
CO2: 25 mmol/L (ref 22–32)
Calcium: 8.4 mg/dL — ABNORMAL LOW (ref 8.9–10.3)
Chloride: 108 mmol/L (ref 98–111)
Creatinine, Ser: 1.26 mg/dL — ABNORMAL HIGH (ref 0.44–1.00)
GFR calc Af Amer: 42 mL/min — ABNORMAL LOW (ref >60)
GFR calc non Af Amer: 36 mL/min — ABNORMAL LOW (ref >60)
Glucose, Bld: 100 mg/dL — ABNORMAL HIGH (ref 70–99)
Potassium: 3.7 mmol/L (ref 3.5–5.1)
Sodium: 140 mmol/L (ref 135–145)

## 2018-12-25 LAB — URINE CULTURE: Culture: >=100000 — AB

## 2018-12-25 MED ORDER — CEFUROXIME AXETIL 250 MG PO TABS: 250.0000 mg | ORAL_TABLET | Freq: Two times a day (BID) | ORAL | 0 refills | Status: AC

## 2018-12-25 NOTE — Progress Notes (Signed)
New referral for Palliative to follow at [facility] received from [place]. Patient information faxed to referral. Flo Shanks BSN, RN, [place] [phone]

## 2018-12-25 NOTE — Progress Notes (Signed)
Pt is being discharged to [facility]. Called report to [facility], LPN.  AVS discharge papers placed in discharge envelope. Awaiting EMS for transportation.

## 2018-12-25 NOTE — Consult Note (Signed)
Consultation Note Date: 12/25/2018   Patient Name: Madeline PieriniHelen E Kashuba  DOB: 1920-08-14  MRN: [id number]  Age / Sex: 85 y.o., female   PCP: Danella PentonMiller, Mark F, MD Referring Physician: Houston SirenSainani, Vivek J, MD   REASON FOR CONSULTATION:Establishing goals of care  Palliative Care consult requested for this 85 y.o. female with multiple medical problems including Alzheimer's dementia, hypertension, and osteoporosis. She presented to ED from [facility] after falling 2 days in a row. It is reported patient has progressively declined over the past 3 years. Urinalysis positive for UTI. She is receiving IV Rocephin and hydration due to acute renal failure. Palliative Medicine team consulted for goals of care.   Clinical Assessment and Goals of Care: I have reviewed medical records including lab results, imaging, Epic notes, and MAR, received report from the bedside RN, and assessed the patient. I spoke with her son, Delsa GranaDavid [place] via phone due to COVID-19 restrictions,  to discuss diagnosis prognosis, GOC, EOL wishes, disposition and options. Patient is awake and alert to self. She denies pain. Asking to have her blinds open for sunlight.   I introduced Palliative Medicine as specialized medical care for people living with serious illness. It focuses on providing relief from the symptoms and stress of a serious illness. The goal is to improve quality of life for both the patient and the family.  We discussed a brief life review of the patient, along with her functional and nutritional status. Son reports patient has been a resident at [facility] for several years. He reports she has 2 sons. She enjoys spending time with family and friends.   Prior to admission son reports patient was functioning well. She required some assistance with ADLs. He reports staff would assist with bathing and dressing. He states she has become more weaker over the past year however, and seemed to become more unsteady on her feet  when attempting to ambulate alone. He reports worsening dementia however, she is still familiar with family and some of the staff at facility. He states her appetite has decreased however, she has never had a large appetite. She was working with PT some at the facility and felt she was showing some signs of strengthening. She ambulates with a walker and assistance.   We discussed Her current illness and what it means in the larger context of Her on-going co-morbidities. With specific discussions regarding her worsening dementia, reoccurring falls, and overall functional and nutritional decline.  Natural disease trajectory and expectations at EOL were discussed. Son verbalized understanding of his mother's current illness and co-morbidities.   He shares he knows her health is beginning to decline, however he and his brother still feels that she has a good quality of life. We discussed in details her Alzheimer's and trajectory. He verbalized understanding and appreciation of discussion.   I attempted to elicit values and goals of care important to the patient.    The difference between aggressive medical intervention and comfort care was considered in light of the patient's goals of care. At this time his request is to continue with current care plan. He does state in the future if she continues to decline in health he now has a full awareness and know what signs, challenges, and decisions will come.   Patient does have a documented advanced directive. Both sons Onalee HuaDavid and Murvin DonningSteve Mariani are her POA. Brett CanalesSteve has agreed that Onalee HuaDavid should be contacted initially and he will be the point of contact. Family confirms  wishes for DNR/DNI. Concepts specific artifical feeding and hydration, and rehospitalization were considered and discussed. Family expressed they would not be interested in any forms of artifical feedings, but would consider hydration and hospitalization if needed to assist during an acute illness  period.   Hospice and Palliative Care services outpatient were explained and offered. Family verbalized their understanding and awareness of both palliative and hospice's goals and philosophy of care. Son is requesting outpatient palliative for support with awareness he may transition care to hospice when family feels this is most appropriate and aligned with goals.   Questions and concerns were addressed. The family was encouraged to call with questions or concerns.  PMT will continue to support holistically.   SOCIAL HISTORY:     reports that she has never smoked. She has never used smokeless tobacco. She reports that she does not drink alcohol or use drugs.  CODE STATUS: DNR  ADVANCE DIRECTIVES: Shanon Brow Vance Peper (Sons)    SYMPTOM MANAGEMENT: per attending   Palliative Prophylaxis:   Aspiration, Bowel Regimen, Delirium Protocol, Frequent Pain Assessment, Oral Care and Turn Reposition  PSYCHO-SOCIAL/SPIRITUAL:  Support System: Family   Desire for further Chaplaincy support:NO   Additional Recommendations (Limitations, Scope, Preferences):  Full Scope Treatment and No Artificial Feeding   PAST MEDICAL HISTORY: Past Medical History:  Diagnosis Date  . Alzheimer disease ([place])   . Dementia ([place])   . Hypertension   . Osteoporosis     PAST SURGICAL HISTORY: No past surgical history on file.  ALLERGIES:  is allergic to celebrex [celecoxib] and naprosyn [naproxen].   MEDICATIONS:  Current Facility-Administered Medications  Medication Dose Route Frequency Provider Last Rate Last Dose  . acetaminophen (TYLENOL) tablet 650 mg  650 mg Oral Q6H PRN Demetrios Loll, MD   650 mg at 12/24/18 2207   Or  . acetaminophen (TYLENOL) suppository 650 mg  650 mg Rectal Q6H PRN Demetrios Loll, MD      . albuterol (PROVENTIL) (2.5 MG/3ML) 0.083% nebulizer solution 2.5 mg  2.5 mg Nebulization Q2H PRN Demetrios Loll, MD      . aspirin chewable tablet 81 mg  81 mg Oral Daily Demetrios Loll, MD   81 mg  at 12/24/18 1043  . bisacodyl (DULCOLAX) EC tablet 5 mg  5 mg Oral Daily PRN Demetrios Loll, MD      . calcium carbonate (OS-CAL - dosed in mg of elemental calcium) tablet 500 mg of elemental calcium  500 mg of elemental calcium Oral BID Demetrios Loll, MD   500 mg of elemental calcium at 12/23/18 2303  . cefTRIAXone (ROCEPHIN) 1 g in sodium chloride 0.9 % 100 mL IVPB  1 g Intravenous Q24H Demetrios Loll, MD 200 mL/hr at 12/24/18 1847 1 g at 12/24/18 1847  . cholecalciferol (VITAMIN D3) tablet 1,000 Units  1,000 Units Oral BID Demetrios Loll, MD   1,000 Units at 12/24/18 2207  . enoxaparin (LOVENOX) injection 30 mg  30 mg Subcutaneous Q24H Henreitta Leber, MD   30 mg at 12/24/18 2207  . latanoprost (XALATAN) 0.005 % ophthalmic solution 1 drop  1 drop Both Eyes QHS Demetrios Loll, MD   1 drop at 12/24/18 2208  . magnesium oxide (MAG-OX) tablet 400 mg  400 mg Oral Daily Demetrios Loll, MD   400 mg at 12/24/18 1043  . multivitamin with minerals tablet 1 tablet  1 tablet Oral Daily Demetrios Loll, MD   1 tablet at 12/24/18 1043  . multivitamin-lutein (OCUVITE-LUTEIN) capsule 1 capsule  1 capsule Oral BID Shaune Pollackhen, Qing, MD   1 capsule at 12/23/18 2303  . ondansetron (ZOFRAN) tablet 4 mg  4 mg Oral Q6H PRN Shaune Pollackhen, Qing, MD       Or  . ondansetron [facility](ZOFRAN) injection 4 mg  4 mg Intravenous Q6H PRN Shaune Pollackhen, Qing, MD      . senna-docusate (Senokot-S) tablet 1 tablet  1 tablet Oral QHS PRN Shaune Pollackhen, Qing, MD      . traZODone (DESYREL) tablet 50 mg  50 mg Oral QHS Shaune Pollackhen, Qing, MD   50 mg at 12/24/18 2207    VITAL SIGNS: BP (!) 171/74 (BP Location: Right Arm)   Pulse 70   Temp 98.3 F (36.8 C) (Oral)   Resp 16   Ht 5\' 2"  (1.575 m)   Wt 67.6 kg   SpO2 99%   BMI 27.25 kg/m  Filed Weights   12/23/18 1531 12/23/18 2100  Weight: 65.8 kg 67.6 kg    Estimated body mass index is 27.25 kg/m as calculated from the following:   Height as of this encounter: 5\' 2"  (1.575 m).   Weight as of this encounter: 67.6 kg.  LABS: CBC:    Component  Value Date/Time   WBC 9.5 12/24/2018 0620   HGB 12.2 12/24/2018 0620   HCT 35.7 (L) 12/24/2018 0620   PLT 158 12/24/2018 0620   Comprehensive Metabolic Panel:    Component Value Date/Time   NA 140 12/25/2018 0519   K 3.7 12/25/2018 0519   K 4.3 08/16/2012 1126   CO2 25 12/25/2018 0519   BUN 27 (H) 12/25/2018 0519   CREATININE 1.26 (H) 12/25/2018 0519   ALBUMIN 4.1 12/22/2018 2320     Review of Systems  Unable to perform ROS: Dementia   Physical Exam General: NAD, chronically-ill appearing, thin Cardiovascular: regular rate and rhythm Pulmonary: clear ant fields, diminished bases Abdomen: soft, nontender, + bowel sounds Extremities: no edema, no joint deformities Skin: no rashes Neurological: Weakness, awake, alert and oriented to self, dementia    Prognosis: Guarded to Poor in the setting of worsening dementia, decreased po intake, re-ocurring falls, UTI, generalized weakness, glaucoma, osteoporosis.   Discharge Planning:  [facility] with [facility] palliative   Recommendations:  DNR/DNI-as confirmed by son  Continue with current plan of care  Sons remain hopeful for improvement but preparing for worst (further decline with advanced dementia)  Outpatient Palliative (Referral Placed)  PMT will continue to support and follow.    Palliative Performance Scale: PPS 30%             Family expressed understanding and was in agreement with this plan.   The above conversation was completed via telephone due to the visitor restrictions during the COVID-19 pandemic. Thorough chart review and discussion with necessary members of the care team was completed as part of assessment.   Thank you for allowing the Palliative Medicine Team to assist in the care of this patient.  Time In: 0815 Time Out: 0920 Time Total: 65 min   Visit consisted of counseling and education dealing with the complex and emotionally intense issues of symptom management and palliative care in the  setting of serious and potentially life-threatening illness.Greater than 50%  of this time was spent counseling and coordinating care related to the above assessment and plan.  Signed by:  Willette AlmaNikki Pickenpack-Cousar, AGPCNP-BC Palliative Medicine Team  Phone: [phone] Fax: [phone] Pager: [phone] Amion: Thea AlkenN. Cousar

## 2018-12-25 NOTE — TOC Transition Note (Addendum)
Transition of Care [facility]) - CM/SW Discharge Note   Patient Details  Name: Madeline Franco MRN: [id number] Date of Birth: Jun 13, 1921  [facility] CM/SW Contact:  Sadiel Mota, Lenice Llamas Phone Number: [phone]  12/25/2018, 1:55 PM   Clinical Narrative:  Per MD patient needs to go to a higher level of care. Per Seth Bake admissions coordinator at [facility] patient can come to the SNF section room 315 today. Per Seth Bake patient is from [facility] ALF side. CSW contacted patient's son Richardson Landry and Shanon Brow and told them that patient can D/C to [facility] today and use her 3 free respite days and then pay $295 for room and board afterwards. Sons are in agreement with plan. Per Seth Bake patient can use the 3 day medicare waiver in order for medicare to pay for SNF. Clinical Education officer, museum (CSW) sent D/C orders to [facility] via [place]. Patient is aware of above. RN will call report at [phone] and arrange EMS for transport. CSW left patient's son Shanon Brow a voicemail explaining that [employer] is going to use the 3 day waiver in order for medicare to pay for SNF. [facility] hospice care liaison is aware that patient will need outpatient palliative care per palliative NP. Please reconsult if future social work needs arise. CSW signing off.    Patient's son Shanon Brow called CSW and was made aware of 3 day waiver.      Final next level of care: Skilled Nursing Facility Barriers to Discharge: Barriers Resolved   Patient Goals and CMS Choice Patient states their goals for this hospitalization and ongoing recovery are:: Improve mobility   Choice offered to / list presented to : Patient  Discharge Placement   Existing PASRR number confirmed : 12/25/18          Patient chooses bed at: [facility] Patient to be transferred to facility by: [facility] Name of family member notified: Patient's sons Richardson Landry and Shanon Brow are aware of D/C today. Patient and family notified of of transfer:  12/25/18  Discharge Plan and Services                                     Social Determinants of Health (SDOH) Interventions     Readmission Risk Interventions No flowsheet data found.

## 2018-12-25 NOTE — NC FL2 (Signed)
[place] LEVEL OF CARE SCREENING TOOL     IDENTIFICATION  Patient Name: Madeline Franco Birthdate: [birth year] Sex: female Admission Date (Current Location): 12/23/2018  [place] and [place] Number:  Engineering geologist and Address:  [facility], [address] [place], [place] [address]      Provider Number: [id number]  Attending Physician Name and Address:  Henreitta Leber, MD  Relative Name and Phone Number:       Current Level of Care: Hospital Recommended Level of Care: [place] Prior Approval Number:    Date Approved/Denied:   PASRR Number: [id number]  Discharge Plan: SNF    Current Diagnoses: Patient Active Problem List   Diagnosis Date Noted  . UTI (urinary tract infection) 12/23/2018    Orientation RESPIRATION BLADDER Height & Weight     Self  Normal Incontinent Weight: 149 lb (67.6 kg) Height:  5\' 2"  (157.5 cm)  BEHAVIORAL SYMPTOMS/MOOD NEUROLOGICAL BOWEL NUTRITION STATUS      Incontinent Diet(Diet: Regular)  AMBULATORY STATUS COMMUNICATION OF NEEDS Skin   Extensive Assist Verbally Normal                       Personal Care Assistance Level of Assistance  Bathing, Feeding, Dressing Bathing Assistance: Limited assistance Feeding assistance: Independent Dressing Assistance: Limited assistance     Functional Limitations Info  Sight, Hearing, Speech Sight Info: Adequate Hearing Info: Adequate Speech Info: Adequate    SPECIAL CARE FACTORS FREQUENCY  PT (By licensed PT), OT (By licensed OT)     PT Frequency: 5 OT Frequency: 5            Contractures      Additional Factors Info  Code Status, Allergies Code Status Info: DNR Allergies Info: Celebrex Celecoxil, Naprosyn Naproxen           Current Medications (12/25/2018):  This is the current hospital active medication list Current Facility-Administered Medications  Medication Dose Route Frequency Provider Last Rate Last  Dose  . acetaminophen (TYLENOL) tablet 650 mg  650 mg Oral Q6H PRN Demetrios Loll, MD   650 mg at 12/24/18 2207   Or  . acetaminophen (TYLENOL) suppository 650 mg  650 mg Rectal Q6H PRN Demetrios Loll, MD      . albuterol (PROVENTIL) (2.5 MG/3ML) 0.083% nebulizer solution 2.5 mg  2.5 mg Nebulization Q2H PRN Demetrios Loll, MD      . aspirin chewable tablet 81 mg  81 mg Oral Daily Demetrios Loll, MD   81 mg at 12/25/18 0934  . bisacodyl (DULCOLAX) EC tablet 5 mg  5 mg Oral Daily PRN Demetrios Loll, MD      . calcium carbonate (OS-CAL - dosed in mg of elemental calcium) tablet 500 mg of elemental calcium  500 mg of elemental calcium Oral BID Demetrios Loll, MD   500 mg of elemental calcium at 12/25/18 0934  . cefTRIAXone (ROCEPHIN) 1 g in sodium chloride 0.9 % 100 mL IVPB  1 g Intravenous Q24H Demetrios Loll, MD   Stopped at 12/24/18 1918  . cholecalciferol (VITAMIN D3) tablet 1,000 Units  1,000 Units Oral BID Demetrios Loll, MD   1,000 Units at 12/25/18 [phone]  . enoxaparin (LOVENOX) injection 30 mg  30 mg Subcutaneous Q24H Henreitta Leber, MD   30 mg at 12/24/18 2207  . latanoprost (XALATAN) 0.005 % ophthalmic solution 1 drop  1 drop Both Eyes QHS Demetrios Loll, MD   1 drop at 12/24/18  2208  . magnesium oxide (MAG-OX) tablet 400 mg  400 mg Oral Daily Shaune Pollackhen, Qing, MD   400 mg at 12/25/18 0934  . multivitamin with minerals tablet 1 tablet  1 tablet Oral Daily Shaune Pollackhen, Qing, MD   1 tablet at 12/25/18 [phone]  . multivitamin-lutein (OCUVITE-LUTEIN) capsule 1 capsule  1 capsule Oral BID Shaune Pollackhen, Qing, MD   1 capsule at 12/23/18 2303  . ondansetron (ZOFRAN) tablet 4 mg  4 mg Oral Q6H PRN Shaune Pollackhen, Qing, MD       Or  . ondansetron [facility](ZOFRAN) injection 4 mg  4 mg Intravenous Q6H PRN Shaune Pollackhen, Qing, MD      . senna-docusate (Senokot-S) tablet 1 tablet  1 tablet Oral QHS PRN Shaune Pollackhen, Qing, MD      . traZODone (DESYREL) tablet 50 mg  50 mg Oral QHS Shaune Pollackhen, Qing, MD   50 mg at 12/24/18 2207     Discharge Medications: Please see discharge summary for a list of  discharge medications.  Relevant Imaging Results:  Relevant Lab Results:   Additional Information SSN: [id number]  Alyx Mcguirk, Darleen CrockerBailey M, LCSW

## 2018-12-25 NOTE — Discharge Summary (Signed)
[facility] [place] NAME: Madeline Franco    MR#:  [id number]  DATE OF BIRTH:  10-Jun-1921  DATE OF ADMISSION:  12/23/2018 ADMITTING PHYSICIAN: Demetrios Loll, MD  DATE OF DISCHARGE: 12/25/2018  PRIMARY CARE PHYSICIAN: Rusty Aus, MD    ADMISSION DIAGNOSIS:  Acute cystitis without hematuria [N30.00] Fall, initial encounter [W19.XXXA]  DISCHARGE DIAGNOSIS:  Active Problems:   UTI (urinary tract infection)   SECONDARY DIAGNOSIS:   Past Medical History:  Diagnosis Date  . Alzheimer disease ([place])   . Dementia ([place])   . Hypertension   . Osteoporosis     HOSPITAL COURSE:   85 year old female with past medical history of Alzheimer's dementia, hypertension, osteoporosis who presented to the hospital after a fall or being found down and was noted to have a UTI.  1.  Weakness/fall-secondary to underlying UTI with deconditioning and also underlying dementia. Patient was treated for UTI with IV ceftriaxone.  She has clinically improved.  Physical therapy evaluation noted and the recommend home health and discussed with social work and patient to be discharged to the skilled side of [facility] where she resides. -She will finish treatment for UTI with some oral Ceftin for 2 more days.  2.  Urinary tract infection- patient was treated with IV ceftriaxone, urine cultures are positive for E. coli which is fairly pansensitive.  Patient not being discharged on oral Ceftin.  3.  Glaucoma- she will continue latanoprost eyedrops.  4.  Osteoporosis-continue calcium supplements with vitamin D supplements  Pt. To be followed by Palliative care at the SNF.   DISCHARGE CONDITIONS:   [place].   CONSULTS OBTAINED:    DRUG ALLERGIES:   Allergies  Allergen Reactions  . Celebrex [Celecoxib]   . Naprosyn [Naproxen]     DISCHARGE MEDICATIONS:   Allergies as of 12/25/2018      Reactions   Celebrex [celecoxib]    Naprosyn [naproxen]        Medication List    STOP taking these medications   cephALEXin 500 MG capsule Commonly known as: KEFLEX     TAKE these medications   acetaminophen 325 MG tablet Commonly known as: TYLENOL Take 650 mg by mouth at bedtime.   aspirin 81 MG chewable tablet Chew 81 mg by mouth daily.   calcium carbonate 1500 (600 Ca) MG Tabs tablet Commonly known as: OSCAL Take 600 mg of elemental calcium by mouth 2 (two) times daily.   cefUROXime 250 MG tablet Commonly known as: CEFTIN Take 1 tablet (250 mg total) by mouth 2 (two) times daily with a meal for 2 days. Stop date 12/27/18   latanoprost 0.005 % ophthalmic solution Commonly known as: XALATAN Place 1 drop into both eyes at bedtime.   Magnesium 500 MG Tabs Take 500 mg by mouth daily.   meloxicam 7.5 MG tablet Commonly known as: MOBIC Take 7.5 mg by mouth daily.   multivitamin with minerals Tabs tablet Take 1 tablet by mouth daily.   PreserVision AREDS Caps Take 1 capsule by mouth 2 (two) times daily.   traZODone 50 MG tablet Commonly known as: DESYREL Take 50 mg by mouth at bedtime.   triamcinolone cream 0.1 % Commonly known as: KENALOG Apply 1 application topically 2 (two) times daily as needed (dry skin).   Vitamin D-1000 Max St 25 MCG (1000 UT) tablet Generic drug: Cholecalciferol Take 1,000 Units by mouth 2 (two) times a day.         DISCHARGE  INSTRUCTIONS:   DIET:  Regular diet  DISCHARGE CONDITION:  Stable  ACTIVITY:  Activity as tolerated  OXYGEN:  Home Oxygen: No.   Oxygen Delivery: room air  DISCHARGE LOCATION:  nursing home   If you experience worsening of your admission symptoms, develop shortness of breath, life threatening emergency, suicidal or homicidal thoughts you must seek medical attention immediately by calling 911 or calling your MD immediately  if symptoms less severe.  You Must read complete instructions/literature along with all the possible adverse reactions/side effects for  all the Medicines you take and that have been prescribed to you. Take any new Medicines after you have completely understood and accpet all the possible adverse reactions/side effects.   Please note  You were cared for by a hospitalist during your hospital stay. If you have any questions about your discharge medications or the care you received while you were in the hospital after you are discharged, you can call the unit and asked to speak with the hospitalist on call if the hospitalist that took care of you is not available. Once you are discharged, your primary care physician will handle any further medical issues. Please note that NO REFILLS for any discharge medications will be authorized once you are discharged, as it is imperative that you return to your primary care physician (or establish a relationship with a primary care physician if you do not have one) for your aftercare needs so that they can reassess your need for medications and monitor your lab values.     Today   No acute events overnight.  Afebrile, hemodynamic stable.  Will discharge [facility] skilled [facility] facility today.  VITAL SIGNS:  Blood pressure (!) 150/64, pulse 62, temperature 98.6 F (37 C), temperature source Oral, resp. rate 18, height 5\' 2"  (1.575 m), weight 67.6 kg, SpO2 96 %.  I/O:    Intake/Output Summary (Last 24 hours) at 12/25/2018 1038 Last data filed at 12/25/2018 0932 Gross per 24 hour  Intake 1169.81 ml  Output -  Net 1169.81 ml    PHYSICAL EXAMINATION:   GENERAL:  85 y.o.-year-old patient lying in bed in no acute distress.  EYES: Pupils equal, round, reactive to light and accommodation. No scleral icterus. Extraocular muscles intact.  HEENT: Head atraumatic, normocephalic. Oropharynx and nasopharynx clear.  NECK:  Supple, no jugular venous distention. No thyroid enlargement, no tenderness.  LUNGS: Normal breath sounds bilaterally, no wheezing, rales, rhonchi. No use of accessory muscles  of respiration.  CARDIOVASCULAR: S1, S2 normal. No murmurs, rubs, or gallops.  ABDOMEN: Soft, nontender, nondistended. Bowel sounds present. No organomegaly or mass.  EXTREMITIES: No cyanosis, clubbing or edema b/l.    NEUROLOGIC: Cranial nerves II through XII are intact. No focal Motor or sensory deficits b/l.  Globally weak.   PSYCHIATRIC: The patient is alert and oriented x 1.    SKIN: No obvious rash, lesion, or ulcer.   DATA REVIEW:   CBC Recent Labs  Lab 12/24/18 0620  WBC 9.5  HGB 12.2  HCT 35.7*  PLT 158    Chemistries  Recent Labs  Lab 12/22/18 2320  12/25/18 0519  NA 135   < > 140  K 4.0   < > 3.7  CL 101   < > 108  CO2 25   < > 25  GLUCOSE 162*   < > 100*  BUN 24*   < > 27*  CREATININE 1.29*   < > 1.26*  CALCIUM 9.4   < >  8.4*  AST 26  --   --   ALT 18  --   --   ALKPHOS 80  --   --   BILITOT 0.9  --   --    < > = values in this interval not displayed.    Cardiac Enzymes No results for input(s): TROPONINI in the last 168 hours.  Microbiology Results  Results for orders placed or performed during the hospital encounter of 12/23/18  Novel Coronavirus,NAA,(SEND-OUT TO REF LAB - TAT 24-48 hrs); Hosp Order     Status: None   Collection Time: 12/23/18  5:05 PM   Specimen: Nasopharyngeal Swab; Respiratory  Result Value Ref Range Status   SARS-CoV-2, NAA NOT DETECTED NOT DETECTED Final    Comment: (NOTE) Testing was performed using the cobas(R) SARS-CoV-2 test. This test was developed and its performance characteristics determined by [facility]. This test has not been FDA cleared or approved. This test has been authorized by FDA under an Emergency Use Authorization (EUA). This test is only authorized for the duration of time the declaration that circumstances exist justifying the authorization of the emergency use of in vitro diagnostic tests for detection of SARS-CoV-2 virus and/or diagnosis of COVID-19 infection under section 564(b)(1) of the  Act, 21 U.S.C. 161WRU-0(A)(5360bbb-3(b)(1), unless the authorization is terminated or revoked sooner. When diagnostic testing is negative, the possibility of a false negative result should be considered in the context of a patient's recent exposures and the presence of clinical signs and symptoms consistent with COVID-19. An individual without symptoms of COVID-19 and who is not shedding SARS-CoV-2 virus would expect to have  a negative (not detected) result in this assay. Performed At: [facility] [address] [place], [place] [id number] Jolene SchimkeNagendra Sanjai MD NW:[id number]:[phone]    Coronavirus Source NASOPHARYNGEAL  Final    Comment: Performed at [facility], [address] [place], [place] [address]    RADIOLOGY:  Dg Chest 2 View  Result Date: 12/23/2018 CLINICAL DATA:  85 year old female status post possible fall EXAM: CHEST - 2 VIEW COMPARISON:  Prior thoracic spine radiographs 03/17/2017 FINDINGS: Mild cardiomegaly. The mediastinal contours are within normal limits. Atherosclerotic calcifications present in the transverse aorta. A 3.1 x 2.2 x 3.0 cm rounded opacity projects over the right upper lobe concerning for a primary bronchogenic neoplasm. No pleural effusion. Surgical clips in the right axilla suggest prior axillary nodal dissection. No acute osseous abnormality. Stable T8 compression fracture. IMPRESSION: 1. 3.1 cm mass in the anterior right upper lobe concerning for underlying primary bronchogenic cancer versus metastatic disease (given presence of surgical clips in the right axilla suggesting prior breast cancer). Consider further evaluation with CT scan of the chest. 2.  Aortic Atherosclerosis (ICD10-170.0). 3. Stable T8 compression fracture. Electronically Signed   By: Malachy MoanHeath  McCullough M.D.   On: 12/23/2018 16:59   Dg Knee Complete 4 Views Right  Result Date: 12/23/2018 CLINICAL DATA:  Fall. Right knee pain. EXAM: RIGHT KNEE - COMPLETE 4+ VIEW COMPARISON:  Right knee MRI  05/02/2010 FINDINGS: No acute fracture or dislocation is identified. There is likely a small knee joint effusion. Tricompartmental degenerative changes are present including chronic depression and remodeling of the lateral tibial plateau which have progressed from the prior MRI. Femorotibial chondrocalcinosis is noted, and there is extensive atherosclerotic vascular calcification. IMPRESSION: 1. No acute osseous abnormality identified. 2. Tricompartmental degenerative changes. Electronically Signed   By: Sebastian AcheAllen  Grady M.D.   On: 12/23/2018 17:00   Dg Hip Unilat W  Or Wo Pelvis 2-3 Views Left  Result Date: 12/23/2018 CLINICAL DATA:  Fall.  Hip pain. EXAM: DG HIP (WITH OR WITHOUT PELVIS) 2-3V RIGHT COMPARISON:  12/22/2018 FINDINGS: No acute fracture or hip dislocation is identified. Mild bilateral hip osteoarthrosis is again noted. Advanced disc degeneration is partially visualized in the lower lumbar spine. Pelvic phleboliths and atherosclerotic vascular calcifications are noted. IMPRESSION: No acute osseous abnormality identified. Electronically Signed   By: Sebastian AcheAllen  Grady M.D.   On: 12/23/2018 17:03   Dg Hip Unilat W Or Wo Pelvis 2-3 Views Right  Result Date: 12/23/2018 CLINICAL DATA:  Fall. Right hip pain. EXAM: DG HIP (WITH OR WITHOUT PELVIS) 2-3V RIGHT COMPARISON:  12/22/2018 FINDINGS: No acute fracture or hip dislocation is identified. Mild bilateral hip osteoarthrosis is again noted. Advanced disc degeneration is partially visualized in the lower lumbar spine. Pelvic phleboliths and atherosclerotic vascular calcifications are noted. IMPRESSION: No acute osseous abnormality identified. Electronically Signed   By: Sebastian AcheAllen  Grady M.D.   On: 12/23/2018 17:02      Management plans discussed with the patient, family and they are in agreement.  CODE STATUS:     Code Status Orders  (From admission, onward)         Start     Ordered   12/23/18 2112  Do not attempt resuscitation (DNR)  Continuous     Question Answer Comment  In the event of cardiac or respiratory ARREST Do not call a "code blue"   In the event of cardiac or respiratory ARREST Do not perform Intubation, CPR, defibrillation or ACLS   In the event of cardiac or respiratory ARREST Use medication by any route, position, wound care, and other measures to relive pain and suffering. May use oxygen, suction and manual treatment of airway obstruction as needed for comfort.      12/23/18 2111       TOTAL TIME TAKING CARE OF THIS PATIENT: 40 minutes.    Houston SirenVivek J Soriya Worster M.D on 12/25/2018 at 10:38 AM  Between 7am to 6pm - Pager - [phone]  After 6pm go to [email or web address] - Social research officer, governmentpassword EPAS [email or web address]  [facility]  Office  [phone]  CC: Primary care physician; Danella PentonMiller, Mark F, MD

## 2018-12-26 DIAGNOSIS — G301 Alzheimer's disease with late onset: Secondary | ICD-10-CM

## 2018-12-26 DIAGNOSIS — N183 Chronic kidney disease, stage 3 (moderate): Secondary | ICD-10-CM | POA: Diagnosis not present

## 2018-12-26 DIAGNOSIS — M159 Polyosteoarthritis, unspecified: Secondary | ICD-10-CM

## 2018-12-26 DIAGNOSIS — F05 Delirium due to known physiological condition: Secondary | ICD-10-CM

## 2018-12-26 DIAGNOSIS — I10 Essential (primary) hypertension: Secondary | ICD-10-CM

## 2018-12-26 DIAGNOSIS — F39 Unspecified mood [affective] disorder: Secondary | ICD-10-CM

## 2019-01-09 DIAGNOSIS — F39 Unspecified mood [affective] disorder: Secondary | ICD-10-CM | POA: Diagnosis not present

## 2019-01-09 DIAGNOSIS — I10 Essential (primary) hypertension: Secondary | ICD-10-CM | POA: Diagnosis not present

## 2019-01-09 DIAGNOSIS — G301 Alzheimer's disease with late onset: Secondary | ICD-10-CM | POA: Diagnosis not present

## 2019-01-09 DIAGNOSIS — M159 Polyosteoarthritis, unspecified: Secondary | ICD-10-CM | POA: Diagnosis not present

## 2019-02-21 DIAGNOSIS — M1990 Unspecified osteoarthritis, unspecified site: Secondary | ICD-10-CM

## 2019-02-21 DIAGNOSIS — I10 Essential (primary) hypertension: Secondary | ICD-10-CM

## 2019-02-21 DIAGNOSIS — N183 Chronic kidney disease, stage 3 (moderate): Secondary | ICD-10-CM

## 2019-02-21 DIAGNOSIS — B351 Tinea unguium: Secondary | ICD-10-CM

## 2019-02-21 DIAGNOSIS — G309 Alzheimer's disease, unspecified: Secondary | ICD-10-CM

## 2019-02-21 DIAGNOSIS — F39 Unspecified mood [affective] disorder: Secondary | ICD-10-CM

## 2019-02-22 DIAGNOSIS — R21 Rash and other nonspecific skin eruption: Secondary | ICD-10-CM | POA: Diagnosis not present

## 2019-02-27 DIAGNOSIS — F39 Unspecified mood [affective] disorder: Secondary | ICD-10-CM | POA: Diagnosis not present

## 2019-03-19 DIAGNOSIS — S8001XA Contusion of right knee, initial encounter: Secondary | ICD-10-CM | POA: Diagnosis not present

## 2019-03-23 DIAGNOSIS — M1990 Unspecified osteoarthritis, unspecified site: Secondary | ICD-10-CM

## 2019-03-23 DIAGNOSIS — G309 Alzheimer's disease, unspecified: Secondary | ICD-10-CM

## 2019-03-23 DIAGNOSIS — F39 Unspecified mood [affective] disorder: Secondary | ICD-10-CM

## 2019-03-23 DIAGNOSIS — I10 Essential (primary) hypertension: Secondary | ICD-10-CM

## 2019-03-23 DIAGNOSIS — N183 Chronic kidney disease, stage 3 (moderate): Secondary | ICD-10-CM

## 2019-04-02 DIAGNOSIS — F39 Unspecified mood [affective] disorder: Secondary | ICD-10-CM

## 2019-04-02 DIAGNOSIS — R451 Restlessness and agitation: Secondary | ICD-10-CM

## 2019-04-19 DIAGNOSIS — M159 Polyosteoarthritis, unspecified: Secondary | ICD-10-CM

## 2019-04-19 DIAGNOSIS — I10 Essential (primary) hypertension: Secondary | ICD-10-CM

## 2019-04-19 DIAGNOSIS — G301 Alzheimer's disease with late onset: Secondary | ICD-10-CM | POA: Diagnosis not present

## 2019-04-19 DIAGNOSIS — F39 Unspecified mood [affective] disorder: Secondary | ICD-10-CM | POA: Diagnosis not present

## 2019-06-13 DIAGNOSIS — I10 Essential (primary) hypertension: Secondary | ICD-10-CM | POA: Diagnosis not present

## 2019-06-13 DIAGNOSIS — M1990 Unspecified osteoarthritis, unspecified site: Secondary | ICD-10-CM | POA: Diagnosis not present

## 2019-06-13 DIAGNOSIS — F39 Unspecified mood [affective] disorder: Secondary | ICD-10-CM | POA: Diagnosis not present

## 2019-06-13 DIAGNOSIS — N183 Chronic kidney disease, stage 3 unspecified: Secondary | ICD-10-CM | POA: Diagnosis not present

## 2019-07-30 DEATH — deceased

## 2020-03-24 IMAGING — CR RIGHT KNEE - COMPLETE 4+ VIEW
4 series · 4 of 4 positions shown · non-contrast
Comparison: Right knee MRI 05/02/2010

CLINICAL DATA: Fall. Right knee pain.

EXAM:
RIGHT KNEE - COMPLETE 4+ VIEW

[knee ap]
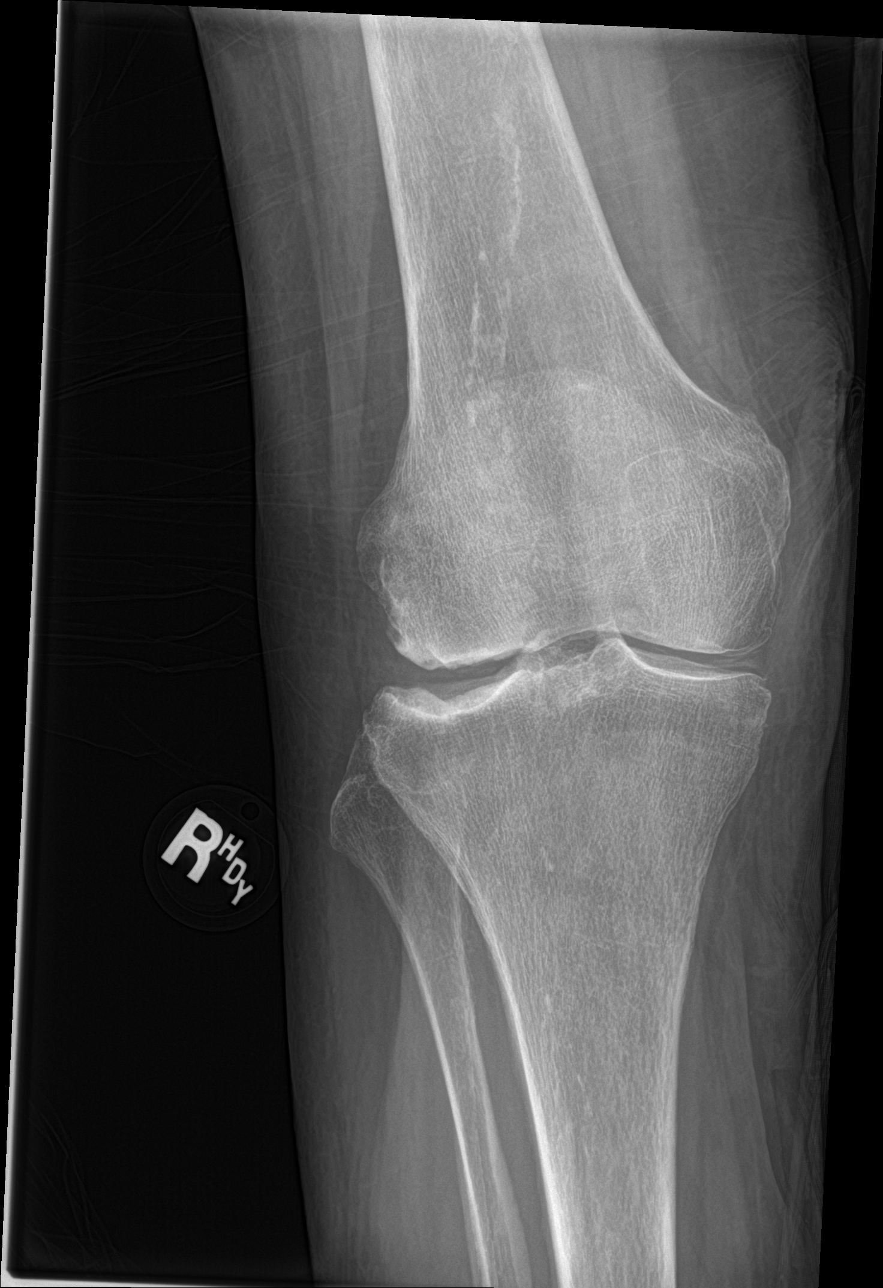

[knee obl (1 of 2)]
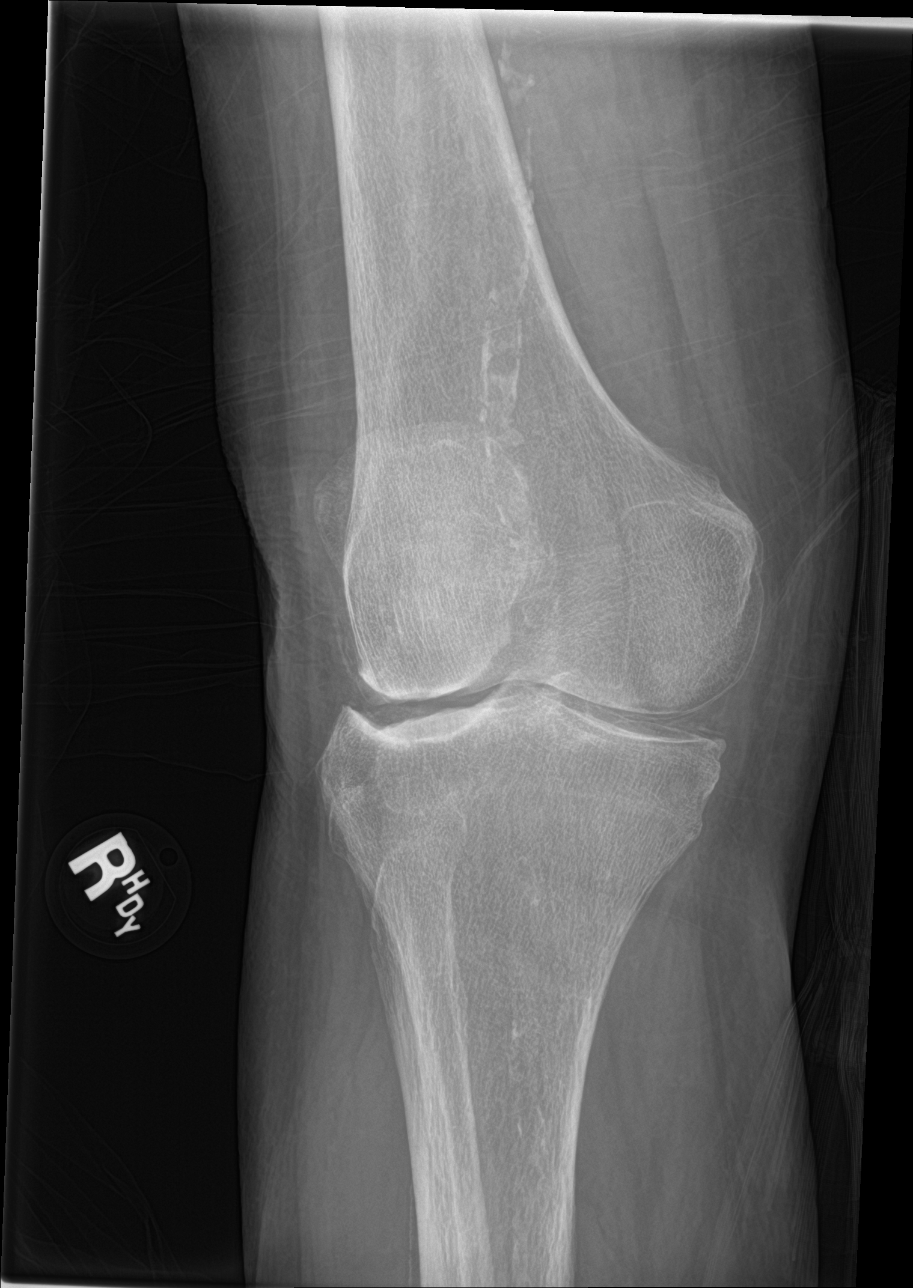

[knee obl (2 of 2)]
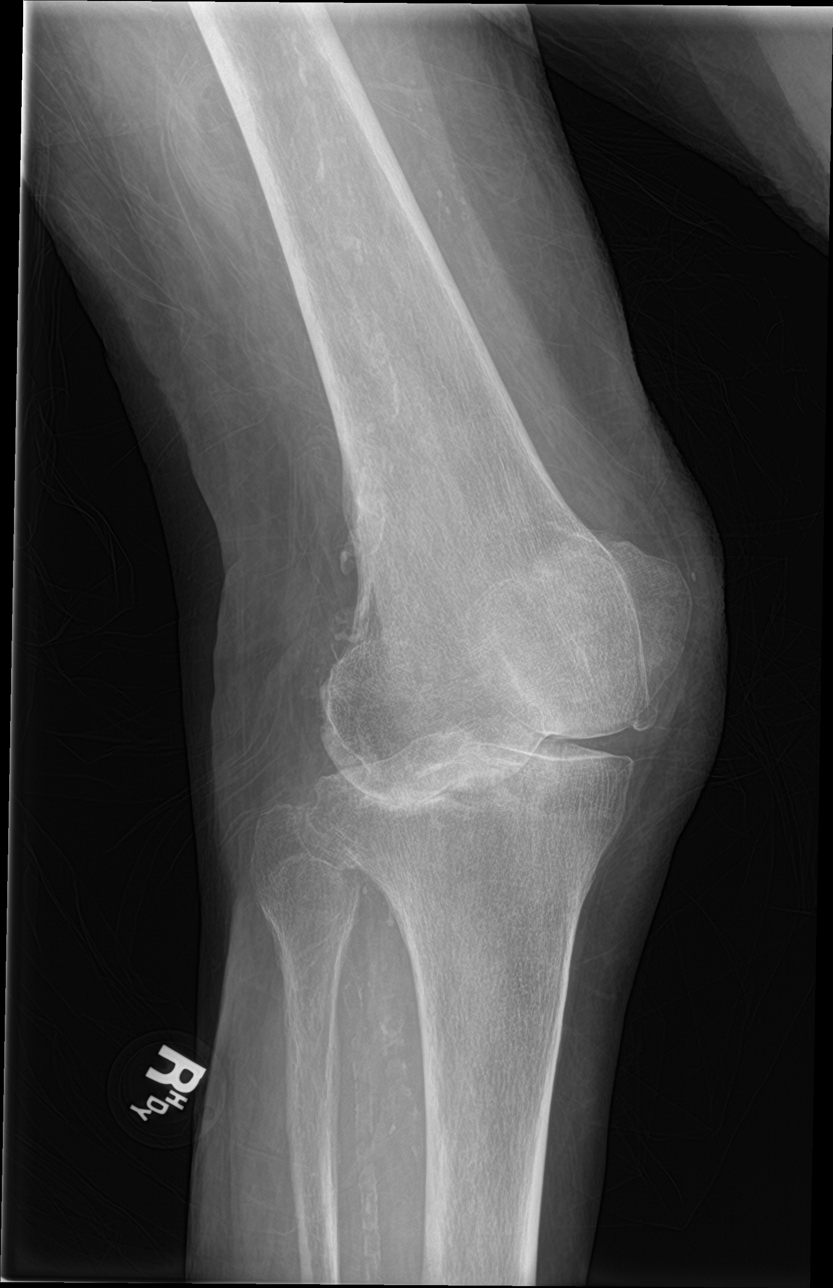

[knee lat]
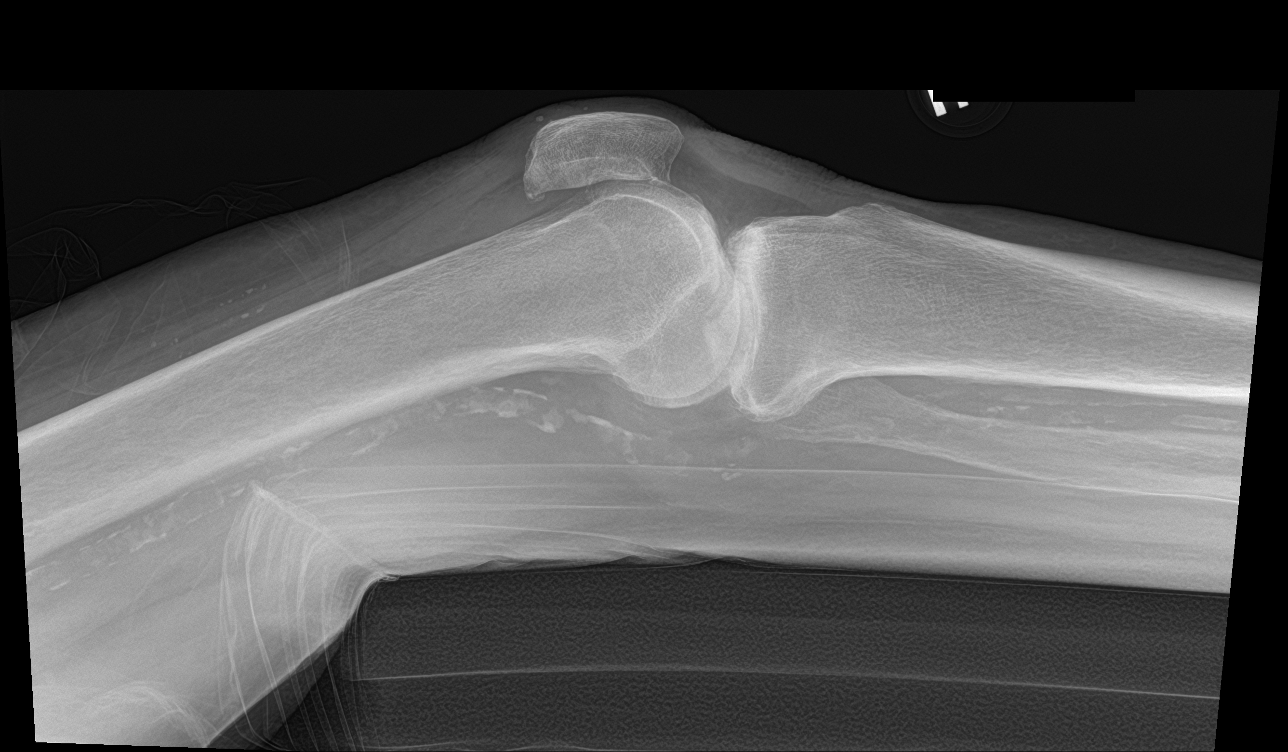

[4 of 4 positions shown; findings below may reference images not displayed]

FINDINGS: No acute fracture or dislocation is identified. There is likely a
small knee joint effusion. Tricompartmental degenerative changes are
present including chronic depression and remodeling of the lateral
tibial plateau which have progressed from the prior MRI.
Femorotibial chondrocalcinosis is noted, and there is extensive
atherosclerotic vascular calcification.
IMPRESSION: 1. No acute osseous abnormality identified.
2. Tricompartmental degenerative changes.

## 2020-03-24 IMAGING — CR CHEST - 2 VIEW
2 series · 2 of 2 positions shown · non-contrast
Comparison: Prior thoracic spine radiographs 03/17/2017

CLINICAL DATA: [AGE] female status post possible fall

EXAM:
CHEST - 2 VIEW

[chest lat]
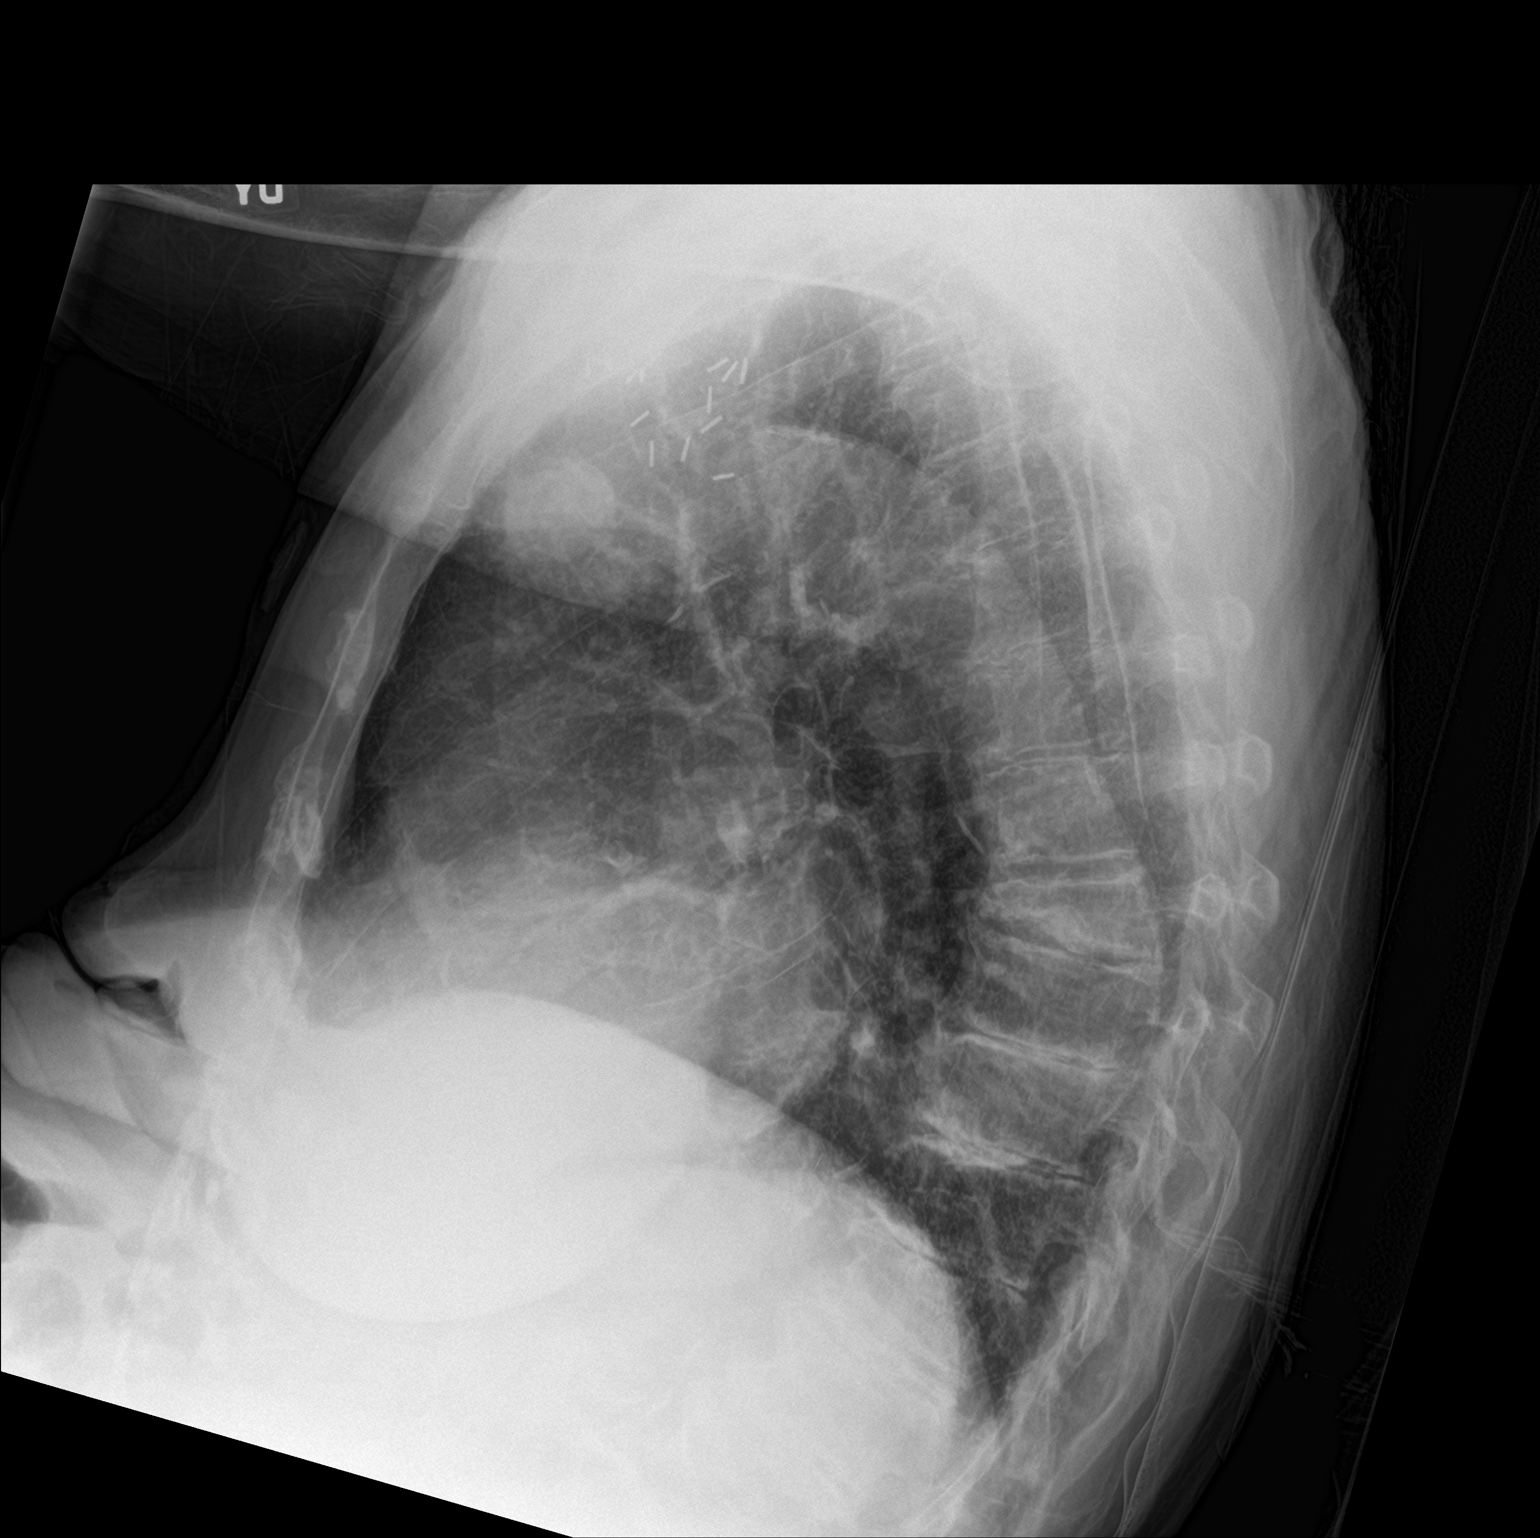

[chest ap]
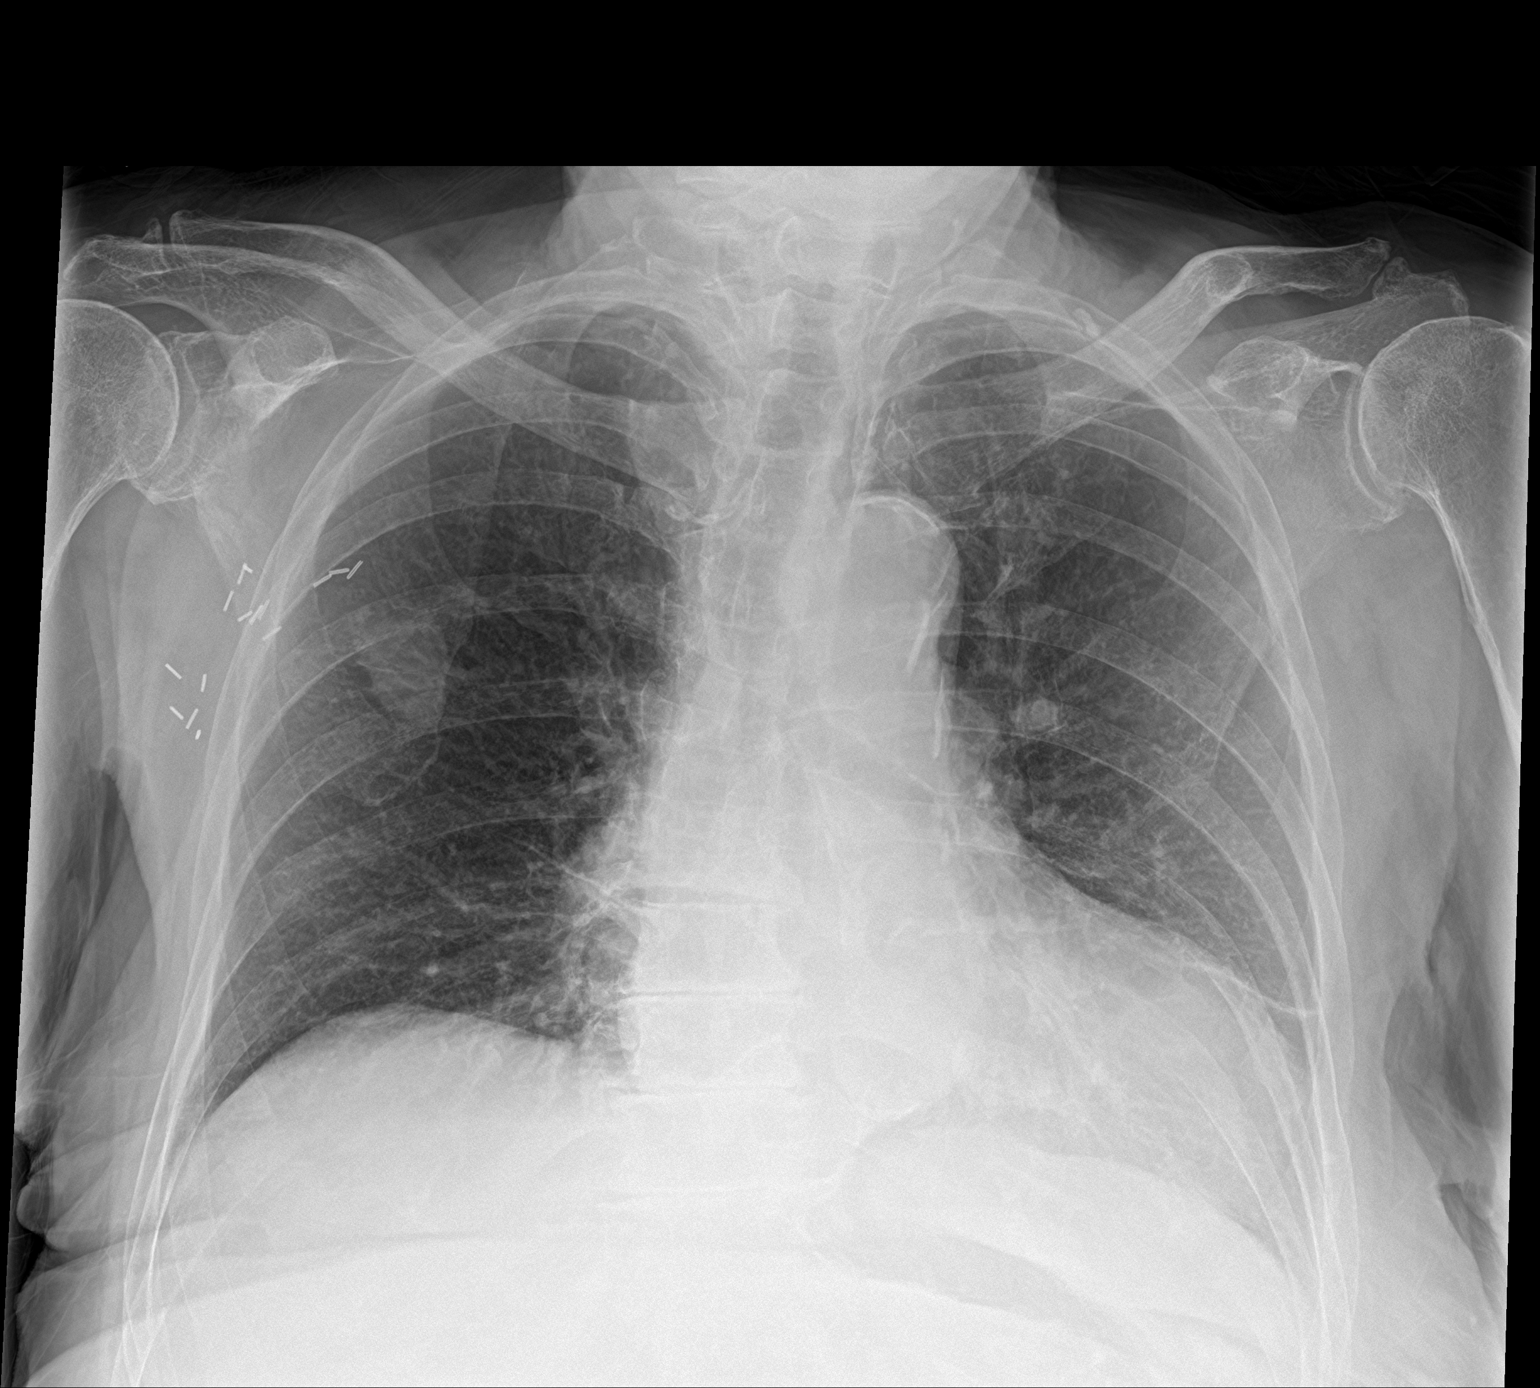

[2 of 2 positions shown; findings below may reference images not displayed]

FINDINGS: Mild cardiomegaly. The mediastinal contours are within normal
limits. Atherosclerotic calcifications present in the transverse
aorta. A 3.1 x 2.2 x 3.0 cm rounded opacity projects over the right
upper lobe concerning for a primary bronchogenic neoplasm. No
pleural effusion. Surgical clips in the right axilla suggest prior
axillary nodal dissection. No acute osseous abnormality. Stable T8
compression fracture.
IMPRESSION: 1. 3.1 cm mass in the anterior right upper lobe concerning for
underlying primary bronchogenic cancer versus metastatic disease
(given presence of surgical clips in the right axilla suggesting
prior breast cancer). Consider further evaluation with CT scan of
the chest.
2.  Aortic Atherosclerosis ([id number]-170.0).
3. Stable T8 compression fracture.
